# Patient Record
Sex: Male | Born: 1980 | Race: White | Hispanic: No | Marital: Married | Smoking: Former smoker
Health system: Southern US, Community
[De-identification: ages and names within clinical notes are randomized; demographics above are authoritative.]

---

## 2004-07-25 HISTORY — PX: MICRODISCECTOMY LUMBAR: SUR864

## 2011-04-19 DIAGNOSIS — F17200 Nicotine dependence, unspecified, uncomplicated: Secondary | ICD-10-CM | POA: Insufficient documentation

## 2011-04-19 DIAGNOSIS — J069 Acute upper respiratory infection, unspecified: Secondary | ICD-10-CM | POA: Insufficient documentation

## 2014-02-26 DIAGNOSIS — R200 Anesthesia of skin: Secondary | ICD-10-CM | POA: Insufficient documentation

## 2014-02-26 DIAGNOSIS — M25529 Pain in unspecified elbow: Secondary | ICD-10-CM | POA: Insufficient documentation

## 2016-10-03 DIAGNOSIS — R21 Rash and other nonspecific skin eruption: Secondary | ICD-10-CM | POA: Insufficient documentation

## 2018-08-08 ENCOUNTER — Ambulatory Visit (INDEPENDENT_AMBULATORY_CARE_PROVIDER_SITE_OTHER): Payer: Self-pay | Admitting: Nurse Practitioner

## 2018-08-08 VITALS — BP 120/80 | HR 68 | Temp 98.6°F | Resp 20 | Wt 194.2 lb

## 2018-08-08 DIAGNOSIS — B9689 Other specified bacterial agents as the cause of diseases classified elsewhere: Secondary | ICD-10-CM

## 2018-08-08 DIAGNOSIS — J019 Acute sinusitis, unspecified: Secondary | ICD-10-CM

## 2018-08-08 DIAGNOSIS — R509 Fever, unspecified: Secondary | ICD-10-CM

## 2018-08-08 LAB — POCT INFLUENZA A/B
INFLUENZA B, POC: NEGATIVE
Influenza A, POC: NEGATIVE

## 2018-08-08 MED ORDER — PSEUDOEPH-BROMPHEN-DM 30-2-10 MG/5ML PO SYRP: 5.0000 mL | ORAL_SOLUTION | Freq: Four times a day (QID) | ORAL | 0 refills | Status: AC | PRN

## 2018-08-08 MED ORDER — FLUTICASONE PROPIONATE 50 MCG/ACT NA SUSP: 2.0000 | Freq: Every day | NASAL | 0 refills | Status: DC

## 2018-08-08 MED ORDER — AMOXICILLIN-POT CLAVULANATE 875-125 MG PO TABS: 1.0000 | ORAL_TABLET | Freq: Two times a day (BID) | ORAL | 0 refills | Status: AC

## 2018-08-08 NOTE — Progress Notes (Signed)
Subjective:  Jesus Carroll is a 38 y.o. male who presents for evaluation of URI like symptoms.  Symptoms include facial pain, headache described as dull ache, nasal congestion, productive cough with brown colored sputum and sore throat.  Onset of symptoms was 7 days ago,, fever started one day ago and has been gradually worsening since that time.  Patient states he was starting to feel better, but suddenly felt worse 1 day ago when the fever started.  Treatment to date:  Nyquil, Ibuprofen.  High risk factors for influenza complications:  none.  The following portions of the patient's history were reviewed and updated as appropriate:  allergies, current medications and past medical history.  Constitutional: positive for anorexia, fatigue and fevers, negative for chills, malaise and sweats Eyes: negative Ears, nose, mouth, throat, and face: positive for nasal congestion and sore throat, negative for ear drainage, earaches and hoarseness Respiratory: positive for cough and sputum, negative for asthma, chronic bronchitis, dyspnea on exertion, pneumonia, stridor and wheezing Cardiovascular: negative Gastrointestinal: positive for decreased appetite, negative for abdominal pain, constipation, diarrhea, nausea and vomiting Neurological: positive for headaches, negative for coordination problems, dizziness, paresthesia, tremors, vertigo and weakness Allergic/Immunologic: positive for hay fever Objective:  BP 120/80 (BP Location: Right Arm, Patient Position: Sitting)   Pulse 68   Temp 98.6 F (37 C)   Resp 20   Wt 194 lb 3.2 oz (88.1 kg)   SpO2 95%  General appearance: alert, cooperative, fatigued and no distress Head: Normocephalic, without obvious abnormality, atraumatic Eyes: conjunctivae/corneas clear. PERRL, EOM's intact. Fundi benign. Ears: normal TM's and external ear canals both ears Nose: no discharge, moderate congestion, turbinates swollen, inflamed, mild maxillary sinus tenderness  bilateral, mild frontal sinus tenderness bilateral Throat: lips, mucosa, and tongue normal; teeth and gums normal Lungs: clear to auscultation bilaterally Heart: regular rate and rhythm, S1, S2 normal, no murmur, click, rub or gallop Abdomen: soft, non-tender; bowel sounds normal; no masses,  no organomegaly Pulses: 2+ and symmetric Skin: Skin color, texture, turgor normal. No rashes or lesions Lymph nodes: cervical and submandibular nodes normal Neurologic: Grossly normal   Rechecked patient's O2 sats: 97% prior to discharge    Assessment:  Acute Bacterial Sinusitis    Plan:    Exam findings, diagnosis etiology and medication use and indications reviewed with patient. Follow- Up and discharge instructions provided. No emergent/urgent issues found on exam.  Based on the patient's clinical presentation, symptoms, and physical assessment, patient symptoms are congruent with that of bacterial etiology.  Patient also has purulent nasal drainage, along with fever although it was not measured.  Patient's gums are consistent with that of double sickening as well.  Will prescribe an antibiotic for the patient along with symptomatic treatment for his cough.  Patient education was provided. Patient verbalized understanding of information provided and agrees with plan of care (POC), all questions answered. The patient is advised to call or return to clinic if condition does not see an improvement in symptoms, or to seek the care of the closest emergency department if condition worsens with the above plan.   1. Fever, unspecified fever cause  - POCT Influenza A/B  2. Acute bacterial sinusitis  - amoxicillin-clavulanate (AUGMENTIN) 875-125 MG tablet; Take 1 tablet by mouth 2 (two) times daily for 7 days.  Dispense: 14 tablet; Refill: 0 - fluticasone (FLONASE) 50 MCG/ACT nasal spray; Place 2 sprays into both nostrils daily for 10 days.  Dispense: 16 g; Refill: 0 - brompheniramine-pseudoephedrine-DM  30-2-10 MG/5ML syrup;  Take 5 mLs by mouth 4 (four) times daily as needed for up to 7 days.  Dispense: 150 mL; Refill: 0 -Take medication as prescribed. -Ibuprofen or Tylenol for pain, fever, or general discomfort. -Increase fluids. -Sleep elevated on at least 2 pillows at bedtime to help with cough. -Use a humidifier or vaporizer when at home and during sleep. -May use a teaspoon of honey or over-the-counter cough drops to help with cough. -May use normal saline nasal spray to help with nasal congestion throughout the day. -Follow-up if with your PCP if symptoms do not improve.

## 2018-08-08 NOTE — Patient Instructions (Signed)
Sinusitis, Adult -Take medication as prescribed. -Ibuprofen or Tylenol for pain, fever, or general discomfort. -Increase fluids. -Sleep elevated on at least 2 pillows at bedtime to help with cough. -Use a humidifier or vaporizer when at home and during sleep. -May use a teaspoon of honey or over-the-counter cough drops to help with cough. -May use normal saline nasal spray to help with nasal congestion throughout the day. -Follow-up if with your PCP if symptoms do not improve.   Sinusitis is inflammation of your sinuses. Sinuses are hollow spaces in the bones around your face. Your sinuses are located:  Around your eyes.  In the middle of your forehead.  Behind your nose.  In your cheekbones. Mucus normally drains out of your sinuses. When your nasal tissues become inflamed or swollen, mucus can become trapped or blocked. This allows bacteria, viruses, and fungi to grow, which leads to infection. Most infections of the sinuses are caused by a virus. Sinusitis can develop quickly. It can last for up to 4 weeks (acute) or for more than 12 weeks (chronic). Sinusitis often develops after a cold. What are the causes? This condition is caused by anything that creates swelling in the sinuses or stops mucus from draining. This includes:  Allergies.  Asthma.  Infection from bacteria or viruses.  Deformities or blockages in your nose or sinuses.  Abnormal growths in the nose (nasal polyps).  Pollutants, such as chemicals or irritants in the air.  Infection from fungi (rare). What increases the risk? You are more likely to develop this condition if you:  Have a weak body defense system (immune system).  Do a lot of swimming or diving.  Overuse nasal sprays.  Smoke. What are the signs or symptoms? The main symptoms of this condition are pain and a feeling of pressure around the affected sinuses. Other symptoms include:  Stuffy nose or congestion.  Thick drainage from your  nose.  Swelling and warmth over the affected sinuses.  Headache.  Upper toothache.  A cough that may get worse at night.  Extra mucus that collects in the throat or the back of the nose (postnasal drip).  Decreased sense of smell and taste.  Fatigue.  A fever.  Sore throat.  Bad breath. How is this diagnosed? This condition is diagnosed based on:  Your symptoms.  Your medical history.  A physical exam.  Tests to find out if your condition is acute or chronic. This may include: ? Checking your nose for nasal polyps. ? Viewing your sinuses using a device that has a light (endoscope). ? Testing for allergies or bacteria. ? Imaging tests, such as an MRI or CT scan. In rare cases, a bone biopsy may be done to rule out more serious types of fungal sinus disease. How is this treated? Treatment for sinusitis depends on the cause and whether your condition is chronic or acute.  If caused by a virus, your symptoms should go away on their own within 10 days. You may be given medicines to relieve symptoms. They include: ? Medicines that shrink swollen nasal passages (topical intranasal decongestants). ? Medicines that treat allergies (antihistamines). ? A spray that eases inflammation of the nostrils (topical intranasal corticosteroids). ? Rinses that help get rid of thick mucus in your nose (nasal saline washes).  If caused by bacteria, your health care provider may recommend waiting to see if your symptoms improve. Most bacterial infections will get better without antibiotic medicine. You may be given antibiotics if you have: ?  A severe infection. ? A weak immune system.  If caused by narrow nasal passages or nasal polyps, you may need to have surgery. Follow these instructions at home: Medicines  Take, use, or apply over-the-counter and prescription medicines only as told by your health care provider. These may include nasal sprays.  If you were prescribed an antibiotic  medicine, take it as told by your health care provider. Do not stop taking the antibiotic even if you start to feel better. Hydrate and humidify   Drink enough fluid to keep your urine pale yellow. Staying hydrated will help to thin your mucus.  Use a cool mist humidifier to keep the humidity level in your home above 50%.  Inhale steam for 10-15 minutes, 3-4 times a day, or as told by your health care provider. You can do this in the bathroom while a hot shower is running.  Limit your exposure to cool or dry air. Rest  Rest as much as possible.  Sleep with your head raised (elevated).  Make sure you get enough sleep each night. General instructions   Apply a warm, moist washcloth to your face 3-4 times a day or as told by your health care provider. This will help with discomfort.  Wash your hands often with soap and water to reduce your exposure to germs. If soap and water are not available, use hand sanitizer.  Do not smoke. Avoid being around people who are smoking (secondhand smoke).  Keep all follow-up visits as told by your health care provider. This is important. Contact a health care provider if:  You have a fever.  Your symptoms get worse.  Your symptoms do not improve within 10 days. Get help right away if:  You have a severe headache.  You have persistent vomiting.  You have severe pain or swelling around your face or eyes.  You have vision problems.  You develop confusion.  Your neck is stiff.  You have trouble breathing. Summary  Sinusitis is soreness and inflammation of your sinuses. Sinuses are hollow spaces in the bones around your face.  This condition is caused by nasal tissues that become inflamed or swollen. The swelling traps or blocks the flow of mucus. This allows bacteria, viruses, and fungi to grow, which leads to infection.  If you were prescribed an antibiotic medicine, take it as told by your health care provider. Do not stop taking  the antibiotic even if you start to feel better.  Keep all follow-up visits as told by your health care provider. This is important. This information is not intended to replace advice given to you by your health care provider. Make sure you discuss any questions you have with your health care provider. Document Released: 07/11/2005 Document Revised: 12/11/2017 Document Reviewed: 12/11/2017 Elsevier Interactive Patient Education  2019 [employer].

## 2018-08-10 ENCOUNTER — Telehealth: Payer: Self-pay

## 2018-08-10 NOTE — Telephone Encounter (Signed)
Patient states he is feeling good. 

## 2018-12-26 ENCOUNTER — Encounter: Payer: Self-pay | Admitting: Primary Care

## 2018-12-26 ENCOUNTER — Other Ambulatory Visit: Payer: Self-pay

## 2018-12-26 ENCOUNTER — Ambulatory Visit: Payer: 59 | Admitting: Primary Care

## 2018-12-26 VITALS — BP 118/82 | HR 69 | Temp 98.2°F | Ht 67.0 in | Wt 198.2 lb

## 2018-12-26 DIAGNOSIS — Z808 Family history of malignant neoplasm of other organs or systems: Secondary | ICD-10-CM

## 2018-12-26 DIAGNOSIS — Z0000 Encounter for general adult medical examination without abnormal findings: Secondary | ICD-10-CM

## 2018-12-26 DIAGNOSIS — Z8042 Family history of malignant neoplasm of prostate: Secondary | ICD-10-CM

## 2018-12-26 NOTE — Progress Notes (Signed)
Subjective:    Patient ID: Lucila MaineStephen F Grassel, male    DOB: [birth year], 38 y.o.   MRN: [id number]  HPI  Mr. Marena ChancyWebber is a 38 year old male who presents today to establish care and for complete physical.  Immunizations: -Tetanus: Completed in July 2019 -Influenza: Gets annually  Diet: He endorses a healthy diet, some vegetables, lean protein, some fruit. Drinks mostly water, some diet soda. Infrequent sweets.   Exercise: He is walking, lifts weights three times weekly. Eye exam: Completed several years ago Dental exam: No recent exam  BP Readings from Last 3 Encounters:  12/26/18 118/82  08/08/18 120/80      Review of Systems  Constitutional: Negative for unexpected weight change.  HENT: Negative for rhinorrhea.   Respiratory: Negative for cough and shortness of breath.   Cardiovascular: Negative for chest pain.  Gastrointestinal: Negative for constipation and diarrhea.  Genitourinary: Negative for difficulty urinating.  Musculoskeletal: Negative for arthralgias and myalgias.  Skin: Negative for rash.  Allergic/Immunologic: Negative for environmental allergies.  Neurological: Negative for dizziness, numbness and headaches.  Psychiatric/Behavioral: The patient is not nervous/anxious.        History reviewed. No pertinent past medical history.   Social History   Socioeconomic History  . Marital status: Married    Spouse name: Not on file  . Number of children: Not on file  . Years of education: Not on file  . Highest education level: Not on file  Occupational History  . Not on file  Social Needs  . Financial resource strain: Not on file  . Food insecurity:    Worry: Not on file    Inability: Not on file  . Transportation needs:    Medical: Not on file    Non-medical: Not on file  Tobacco Use  . Smoking status: Never Smoker  . Smokeless tobacco: Never Used  Substance and Sexual Activity  . Alcohol use: Yes  . Drug use: Not on file  . Sexual activity: Not on  file  Lifestyle  . Physical activity:    Days per week: Not on file    Minutes per session: Not on file  . Stress: Not on file  Relationships  . Social connections:    Talks on phone: Not on file    Gets together: Not on file    Attends religious service: Not on file    Active member of club or organization: Not on file    Attends meetings of clubs or organizations: Not on file    Relationship status: Not on file  . Intimate partner violence:    Fear of current or ex partner: Not on file    Emotionally abused: Not on file    Physically abused: Not on file    Forced sexual activity: Not on file  Other Topics Concern  . 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   Married.   1 child.   [place]     Past Surgical History:  Procedure Laterality Date  . MICRODISCECTOMY LUMBAR  2006    Family History  Problem Relation Age of Onset  . Breast cancer Mother   . Skin cancer Mother   . Prostate cancer Father [id number]  . Thyroid cancer Father   . Lung cancer Maternal Grandfather   . Cancer Paternal Grandfather        Unclear    No Known Allergies  Current Outpatient Medications on  File Prior to Visit  Medication Sig Dispense Refill  . fluticasone (FLONASE) 50 MCG/ACT nasal spray Place 2 sprays into both nostrils daily for 10 days. (Patient taking differently: Place 1 spray into both nostrils daily. ) 16 g 0   No current facility-administered medications on file prior to visit.     BP 118/82   Pulse 69   Temp 98.2 F (36.8 C) (Oral)   Ht 5\' 7"  (1.702 m)   Wt 198 lb 4 oz (89.9 kg)   SpO2 98%   BMI 31.05 kg/m    Objective:   Physical Exam  Constitutional: He is oriented to person, place, and time. He appears well-nourished.  HENT:  Mouth/Throat: No oropharyngeal exudate.  Eyes: Pupils are equal, round, and reactive to light. EOM are normal.  Neck: Neck supple. No thyromegaly present.  Cardiovascular: Normal rate and  regular rhythm.  Respiratory: Effort normal and breath sounds normal.  GI: Soft. Bowel sounds are normal. There is no abdominal tenderness.  Musculoskeletal: Normal range of motion.  Neurological: He is alert and oriented to person, place, and time.  Skin: Skin is warm and dry.  Psychiatric: He has a normal mood and affect.           Assessment & Plan:

## 2018-12-26 NOTE — Assessment & Plan Note (Signed)
Tetanus UTD. Gets influenza vaccination annually. Check PSA given family history of prostate cancer. Encouraged him to continue with regular exercise, work on diet.  Exam unremarkable.  Labs pending. Follow up in 1 year for CPE.

## 2018-12-26 NOTE — Patient Instructions (Addendum)
Stop by the lab prior to leaving today. I will notify you of your results once received.   Continue exercising. You should be getting 150 minutes of moderate intensity exercise weekly.  It's important to improve your diet by reducing consumption of fast food, fried food, processed snack foods, sugary drinks. Increase consumption of fresh vegetables and fruits, whole grains, water.  Ensure you are drinking 64 ounces of water daily.  Check out [facility] in [place] or [facility] in [place].  It was a pleasure to meet you today! Please don't hesitate to call or message me with any questions. Welcome to [facility]!    Preventive Care 40-64 Years, Male Preventive care refers to lifestyle choices and visits with your health care provider that can promote health and wellness. What does preventive care include?   A yearly physical exam. This is also called an annual well check.  Dental exams once or twice a year.  Routine eye exams. Ask your health care provider how often you should have your eyes checked.  Personal lifestyle choices, including: ? Daily care of your teeth and gums. ? Regular physical activity. ? Eating a healthy diet. ? Avoiding tobacco and drug use. ? Limiting alcohol use. ? Practicing safe sex. ? Taking low-dose aspirin every day starting at age 99. What happens during an annual well check? The services and screenings done by your health care provider during your annual well check will depend on your age, overall health, lifestyle risk factors, and family history of disease. Counseling Your health care provider may ask you questions about your:  Alcohol use.  Tobacco use.  Drug use.  Emotional well-being.  Home and relationship well-being.  Sexual activity.  Eating habits.  Work and work Statistician. Screening You may have the following tests or measurements:  Height, weight, and BMI.  Blood pressure.  Lipid and cholesterol levels. These  may be checked every 5 years, or more frequently if you are over 36 years old.  Skin check.  Lung cancer screening. You may have this screening every year starting at age 64 if you have a 30-pack-year history of smoking and currently smoke or have quit within the past 15 years.  Colorectal cancer screening. All adults should have this screening starting at age 35 and continuing until age 17. Your health care provider may recommend screening at age 43. You will have tests every 1-10 years, depending on your results and the type of screening test. People at increased risk should start screening at an earlier age. Screening tests may include: ? Guaiac-based fecal occult blood testing. ? Fecal immunochemical test (FIT). ? Stool DNA test. ? Virtual colonoscopy. ? Sigmoidoscopy. During this test, a flexible tube with a tiny camera (sigmoidoscope) is used to examine your rectum and lower colon. The sigmoidoscope is inserted through your anus into your rectum and lower colon. ? Colonoscopy. During this test, a long, thin, flexible tube with a tiny camera (colonoscope) is used to examine your entire colon and rectum.  Prostate cancer screening. Recommendations will vary depending on your family history and other risks.  Hepatitis C blood test.  Hepatitis B blood test.  Sexually transmitted disease (STD) testing.  Diabetes screening. This is done by checking your blood sugar (glucose) after you have not eaten for a while (fasting). You may have this done every 1-3 years. Discuss your test results, treatment options, and if necessary, the need for more tests with your health care provider. Vaccines Your health care provider may  recommend certain vaccines, such as:  Influenza vaccine. This is recommended every year.  Tetanus, diphtheria, and acellular pertussis (Tdap, Td) vaccine. You may need a Td booster every 10 years.  Varicella vaccine. You may need this if you have not been  vaccinated.  Zoster vaccine. You may need this after age 60.  Measles, mumps, and rubella (MMR) vaccine. You may need at least one dose of MMR if you were born in 1957 or later. You may also need a second dose.  Pneumococcal 13-valent conjugate (PCV13) vaccine. You may need this if you have certain conditions and have not been vaccinated.  Pneumococcal polysaccharide (PPSV23) vaccine. You may need one or two doses if you smoke cigarettes or if you have certain conditions.  Meningococcal vaccine. You may need this if you have certain conditions.  Hepatitis A vaccine. You may need this if you have certain conditions or if you travel or work in places where you may be exposed to hepatitis A.  Hepatitis B vaccine. You may need this if you have certain conditions or if you travel or work in places where you may be exposed to hepatitis B.  Haemophilus influenzae type b (Hib) vaccine. You may need this if you have certain risk factors. Talk to your health care provider about which screenings and vaccines you need and how often you need them. This information is not intended to replace advice given to you by your health care provider. Make sure you discuss any questions you have with your health care provider. Document Released: 08/07/2015 Document Revised: 08/31/2017 Document Reviewed: 05/12/2015 Elsevier Interactive Patient Education  2019 [employer].

## 2018-12-27 LAB — COMPREHENSIVE METABOLIC PANEL
ALT: 37 U/L (ref 0–53)
AST: 30 U/L (ref 0–37)
Albumin: 4.4 g/dL (ref 3.5–5.2)
Alkaline Phosphatase: 77 U/L (ref 39–117)
BUN: 18 mg/dL (ref 6–23)
CO2: 28 mEq/L (ref 19–32)
Calcium: 9.6 mg/dL (ref 8.4–10.5)
Chloride: 103 mEq/L (ref 96–112)
Creatinine, Ser: 1.12 mg/dL (ref 0.40–1.50)
GFR: 72.61 mL/min (ref >60.00)
Glucose, Bld: 79 mg/dL (ref 70–99)
Potassium: 3.7 mEq/L (ref 3.5–5.1)
Sodium: 141 mEq/L (ref 135–145)
Total Bilirubin: 0.5 mg/dL (ref 0.2–1.2)
Total Protein: 7.1 g/dL (ref 6.0–8.3)

## 2018-12-27 LAB — LIPID PANEL
Cholesterol: 244 mg/dL — ABNORMAL HIGH (ref 0–200)
HDL: 40 mg/dL (ref >39.00)
LDL Cholesterol: 165 mg/dL — ABNORMAL HIGH (ref 0–99)
NonHDL: 203.57
Total CHOL/HDL Ratio: 6
Triglycerides: 194 mg/dL — ABNORMAL HIGH (ref 0.0–149.0)
VLDL: 38.8 mg/dL (ref 0.0–40.0)

## 2018-12-27 LAB — PSA: PSA: 1.08 ng/mL (ref 0.10–4.00)

## 2018-12-27 LAB — TSH: TSH: 1.19 u[IU]/mL (ref 0.35–4.50)

## 2018-12-27 LAB — HEMOGLOBIN A1C: Hgb A1c MFr Bld: 5.5 % (ref 4.6–6.5)

## 2018-12-28 ENCOUNTER — Encounter: Payer: Self-pay | Admitting: *Deleted

## 2019-01-10 DIAGNOSIS — Z3141 Encounter for fertility testing: Secondary | ICD-10-CM | POA: Diagnosis not present

## 2019-01-10 DIAGNOSIS — N979 Female infertility, unspecified: Secondary | ICD-10-CM | POA: Diagnosis not present

## 2019-04-15 ENCOUNTER — Ambulatory Visit (INDEPENDENT_AMBULATORY_CARE_PROVIDER_SITE_OTHER): Payer: 59 | Admitting: Family Medicine

## 2019-04-15 ENCOUNTER — Encounter: Payer: Self-pay | Admitting: Family Medicine

## 2019-04-15 VITALS — Temp 97.9°F

## 2019-04-15 DIAGNOSIS — R062 Wheezing: Secondary | ICD-10-CM | POA: Diagnosis not present

## 2019-04-15 DIAGNOSIS — R05 Cough: Secondary | ICD-10-CM

## 2019-04-15 DIAGNOSIS — R059 Cough, unspecified: Secondary | ICD-10-CM

## 2019-04-15 MED ORDER — ALBUTEROL SULFATE HFA 108 (90 BASE) MCG/ACT IN AERS: 2.0000 | INHALATION_SPRAY | RESPIRATORY_TRACT | 0 refills | Status: DC | PRN

## 2019-04-15 MED ORDER — HYDROCODONE-HOMATROPINE 5-1.5 MG/5ML PO SYRP: 5.0000 mL | ORAL_SOLUTION | Freq: Every evening | ORAL | 0 refills | Status: DC | PRN

## 2019-04-15 NOTE — Progress Notes (Signed)
Virtual Visit via Video Note  I connected with Jesus Carroll on 04/15/19 at  2:00 PM EDT by a video enabled telemedicine application and verified that I am speaking with the correct person using two identifiers.  Location: Patient: In his office Provider: [place]   I discussed the limitations of evaluation and management by telemedicine and the availability of in person appointments. The patient expressed understanding and agreed to proceed.  History of Present Illness: Chief Complaint  Patient presents with  . Cough    symptoms since 04/12/2019. Post nasal drainage is present.   This is a 38 yo male with the above CC.  Reports 4 days of cough that started with a tickle in his throat.  He can feel drainage going down the back of his throat and this is made his throat well.  He denies fever, body aches, ear pain, headache, shortness of breath.  He has had some wheeze with cough as well as when lying down.  He has a history of seasonal allergies but no history of asthma or albuterol inhaler use.  He has taken some Claritin-D which opened his nose but did not seem to help with his other symptoms. He had similar symptoms a while back and had negative COVID testing.  No past medical history on file. Past Surgical History:  Procedure Laterality Date  . MICRODISCECTOMY LUMBAR  2006   Family History  Problem Relation Age of Onset  . Breast cancer Mother   . Skin cancer Mother   . Prostate cancer Father 33  . Thyroid cancer Father   . Lung cancer Maternal Grandfather   . Cancer Paternal Grandfather        Unclear   Social History   Tobacco Use  . Smoking status: Never Smoker  . Smokeless tobacco: Never Used  Substance Use Topics  . Alcohol use: Yes  . Drug use: Not on file      Observations/Objective: The patient is alert and answers questions appropriately.  His physical skin is unremarkable.  He is normally conversive without shortness of breath or audible wheeze.   He does have a frequent, barking, dry cough.  His mood and affect are appropriate. Temp 97.9 F (36.6 C) Comment: per patient Wt Readings from Last 3 Encounters:  12/26/18 198 lb 4 oz (89.9 kg)  08/08/18 194 lb 3.2 oz (88.1 kg)    Assessment and Plan: 1. Cough -Likely related to seasonal allergies and postnasal drainage.  He was instructed to continue his daily fluticasone and take long acting, nonsedating antihistamine, avoiding decongestants unless nasal congestion present.  Prescription for nighttime cough medicine sent to his pharmacy and he was instructed to take over-the-counter Delsym or Robitussin-DM for daytime cough suppression. - albuterol (VENTOLIN HFA) 108 (90 Base) MCG/ACT inhaler; Inhale 2 puffs into the lungs every 4 (four) hours as needed for wheezing or shortness of breath (cough, shortness of breath or wheezing.).  Dispense: 8 g; Refill: 0 - HYDROcodone-homatropine (HYCODAN) 5-1.5 MG/5ML syrup; Take 5 mLs by mouth at bedtime as needed for cough.  Dispense: 60 mL; Refill: 0  2. Wheeze -Likely bronchospasm - albuterol (VENTOLIN HFA) 108 (90 Base) MCG/ACT inhaler; Inhale 2 puffs into the lungs every 4 (four) hours as needed for wheezing or shortness of breath (cough, shortness of breath or wheezing.).  Dispense: 8 g; Refill: 0  -Return to clinic precautions reviewed, if no improvement in 7 days or if worsening at any time.  Clarene Reamer, FNP-BC  [place]  [facility], [facility]  04/15/2019 2:16 PM   Follow Up Instructions:    I discussed the assessment and treatment plan with the patient. The patient was provided an opportunity to ask questions and all were answered. The patient agreed with the plan and demonstrated an understanding of the instructions.   The patient was advised to call back or seek an in-person evaluation if the symptoms worsen or if the condition fails to improve as anticipated.   Jesus Belfast, FNP

## 2019-04-23 ENCOUNTER — Encounter: Payer: Self-pay | Admitting: Family Medicine

## 2019-04-24 ENCOUNTER — Other Ambulatory Visit: Payer: Self-pay | Admitting: Family Medicine

## 2019-04-24 DIAGNOSIS — R059 Cough, unspecified: Secondary | ICD-10-CM

## 2019-04-24 DIAGNOSIS — R05 Cough: Secondary | ICD-10-CM

## 2019-04-24 MED ORDER — HYDROCODONE-HOMATROPINE 5-1.5 MG/5ML PO SYRP: 5.0000 mL | ORAL_SOLUTION | Freq: Every evening | ORAL | 0 refills | Status: DC | PRN

## 2019-04-24 NOTE — Telephone Encounter (Signed)
Patient is requesting another Rx for cough suppression. Pt has completed all Rx's give at last OV 04/15/2019. Please advise Jesus Carroll, thanks.  04/15/2019  Assessment and Plan: 1. Cough -Likely related to seasonal allergies and postnasal drainage.  He was instructed to continue his daily fluticasone and take long acting, nonsedating antihistamine, avoiding decongestants unless nasal congestion present.  Prescription for nighttime cough medicine sent to his pharmacy and he was instructed to take over-the-counter Delsym or Robitussin-DM for daytime cough suppression. - albuterol (VENTOLIN HFA) 108 (90 Base) MCG/ACT inhaler; Inhale 2 puffs into the lungs every 4 (four) hours as needed for wheezing or shortness of breath (cough, shortness of breath or wheezing.).  Dispense: 8 g; Refill: 0 - HYDROcodone-homatropine (HYCODAN) 5-1.5 MG/5ML syrup; Take 5 mLs by mouth at bedtime as needed for cough.  Dispense: 60 mL; Refill: 0

## 2019-05-03 ENCOUNTER — Other Ambulatory Visit: Payer: Self-pay

## 2019-05-03 ENCOUNTER — Ambulatory Visit: Payer: 59 | Admitting: Family Medicine

## 2019-05-03 ENCOUNTER — Encounter: Payer: Self-pay | Admitting: Family Medicine

## 2019-05-03 VITALS — BP 110/62 | HR 64 | Temp 98.1°F | Ht 67.0 in | Wt 200.1 lb

## 2019-05-03 DIAGNOSIS — J4520 Mild intermittent asthma, uncomplicated: Secondary | ICD-10-CM

## 2019-05-03 DIAGNOSIS — M545 Low back pain, unspecified: Secondary | ICD-10-CM

## 2019-05-03 DIAGNOSIS — J302 Other seasonal allergic rhinitis: Secondary | ICD-10-CM

## 2019-05-03 DIAGNOSIS — R05 Cough: Secondary | ICD-10-CM | POA: Diagnosis not present

## 2019-05-03 DIAGNOSIS — G8929 Other chronic pain: Secondary | ICD-10-CM | POA: Diagnosis not present

## 2019-05-03 DIAGNOSIS — R053 Chronic cough: Secondary | ICD-10-CM

## 2019-05-03 MED ORDER — IBUPROFEN 800 MG PO TABS: 800.0000 mg | ORAL_TABLET | Freq: Three times a day (TID) | ORAL | 0 refills | Status: DC | PRN

## 2019-05-03 MED ORDER — QVAR REDIHALER 40 MCG/ACT IN AERB: 2.0000 | INHALATION_SPRAY | Freq: Two times a day (BID) | RESPIRATORY_TRACT | 0 refills | Status: DC

## 2019-05-03 MED ORDER — BENZONATATE 100 MG PO CAPS: 100.0000 mg | ORAL_CAPSULE | Freq: Three times a day (TID) | ORAL | 1 refills | Status: DC | PRN

## 2019-05-03 NOTE — Patient Instructions (Addendum)
I believe you have a chronic/cyclical cough that is aggravated by reflux , coughing , and drainage.  Goal is to not Cough or clear throat.   Avoid coughing or clearing throat by using non-mint. products/sugarless candy, water, ice chips. Remember NO MINT or Menthol PRODUCTS  Mucinex DM 1-2 every 12 hrs or Delsym 2 tsp every 12 hrs f or cough Tessalon Three times a day  As needed  Cough.   Take omeprazole nightly for 2 weeks   Zyrtec 10mg  at bedtime Chlor tabs 4mg  2 at bedtime  for nasal drip  I have put in referral to allergist   Back Exercises These exercises help to make your trunk and back strong. They also help to keep the lower back flexible. Doing these exercises can help to prevent back pain or lessen existing pain.  If you have back pain, try to do these exercises 2-3 times each day or as told by your doctor.  As you get better, do the exercises once each day. Repeat the exercises more often as told by your doctor.  To stop back pain from coming back, do the exercises once each day, or as told by your doctor. Exercises Single knee to chest Do these steps 3-5 times in a row for each leg: 1. Lie on your back on a firm bed or the floor with your legs stretched out. 2. Bring one knee to your chest. 3. Grab your knee or thigh with both hands and hold them it in place. 4. Pull on your knee until you feel a gentle stretch in your lower back or buttocks. 5. Keep doing the stretch for 10-30 seconds. 6. Slowly let go of your leg and straighten it. Pelvic tilt Do these steps 5-10 times in a row: 1. Lie on your back on a firm bed or the floor with your legs stretched out. 2. Bend your knees so they point up to the ceiling. Your feet should be flat on the floor. 3. Tighten your lower belly (abdomen) muscles to press your lower back against the floor. This will make your tailbone point up to the ceiling instead of pointing down to your feet or the floor. 4. Stay in this position for 5-10  seconds while you gently tighten your muscles and breathe evenly. Cat-cow Do these steps until your lower back bends more easily: 1. Get on your hands and knees on a firm surface. Keep your hands under your shoulders, and keep your knees under your hips. You may put padding under your knees. 2. Let your head hang down toward your chest. Tighten (contract) the muscles in your belly. Point your tailbone toward the floor so your lower back becomes rounded like the back of a cat. 3. Stay in this position for 5 seconds. 4. Slowly lift your head. Let the muscles of your belly relax. Point your tailbone up toward the ceiling so your back forms a sagging arch like the back of a cow. 5. Stay in this position for 5 seconds.  Press-ups Do these steps 5-10 times in a row: 1. Lie on your belly (face-down) on the floor. 2. Place your hands near your head, about shoulder-width apart. 3. While you keep your back relaxed and keep your hips on the floor, slowly straighten your arms to raise the top half of your body and lift your shoulders. Do not use your back muscles. You may change where you place your hands in order to make yourself more comfortable. 4. Stay in  this position for 5 seconds. 5. Slowly return to lying flat on the floor.  Bridges Do these steps 10 times in a row: 1. Lie on your back on a firm surface. 2. Bend your knees so they point up to the ceiling. Your feet should be flat on the floor. Your arms should be flat at your sides, next to your body. 3. Tighten your butt muscles and lift your butt off the floor until your waist is almost as high as your knees. If you do not feel the muscles working in your butt and the back of your thighs, slide your feet 1-2 inches farther away from your butt. 4. Stay in this position for 3-5 seconds. 5. Slowly lower your butt to the floor, and let your butt muscles relax. If this exercise is too easy, try doing it with your arms crossed over your chest. Belly  crunches Do these steps 5-10 times in a row: 1. Lie on your back on a firm bed or the floor with your legs stretched out. 2. Bend your knees so they point up to the ceiling. Your feet should be flat on the floor. 3. Cross your arms over your chest. 4. Tip your chin a little bit toward your chest but do not bend your neck. 5. Tighten your belly muscles and slowly raise your chest just enough to lift your shoulder blades a tiny bit off of the floor. Avoid raising your body higher than that, because it can put too much stress on your low back. 6. Slowly lower your chest and your head to the floor. Back lifts Do these steps 5-10 times in a row: 1. Lie on your belly (face-down) with your arms at your sides, and rest your forehead on the floor. 2. Tighten the muscles in your legs and your butt. 3. Slowly lift your chest off of the floor while you keep your hips on the floor. Keep the back of your head in line with the curve in your back. Look at the floor while you do this. 4. Stay in this position for 3-5 seconds. 5. Slowly lower your chest and your face to the floor. Contact a doctor if:  Your back pain gets a lot worse when you do an exercise.  Your back pain does not get better 2 hours after you exercise. If you have any of these problems, stop doing the exercises. Do not do them again unless your doctor says it is okay. Get help right away if:  You have sudden, very bad back pain. If this happens, stop doing the exercises. Do not do them again unless your doctor says it is okay. This information is not intended to replace advice given to you by your health care provider. Make sure you discuss any questions you have with your health care provider. Document Released: 08/13/2010 Document Revised: 04/05/2018 Document Reviewed: 04/05/2018 Elsevier Patient Education  2020 [employer].

## 2019-05-03 NOTE — Progress Notes (Signed)
Subjective:    Patient ID: Jesus Carroll, male    DOB: [birth year], 38 y.o.   MRN: [id number]  HPI Chief Complaint  Patient presents with   Cough    Pt reports having this coughing episode x 3 weeks. Productive now with white/light yellow mucous. No noticed SOB, Chest tx or px. Treating with Rx cough syrup and Delsym   This is a 38 year old male seen today for above chief complaint.  I saw him for a virtual visit 04/15/2019 for complaint of cough that had been going on for 4 days.  His cough was felt likely due to seasonal allergies and postnasal drainage and he was to continue his fluticasone and add a long-acting nonsedating antihistamine.  He was given a prescription for nighttime cough medication and instructed to use over-the-counter medication during the day for cough.  He was also given an albuterol inhaler for intermittent wheeze and cough.  He does feel that the inhaler helps a coughing spell.  He reports improvement in postnasal drainage and feeling of congestion but reports continued cough.  The cough happens throughout the day and is usually triggered by talking which he is required to do quite a lot of for his job.  He reports that he feels a tickle in his throat and once he starts coughing he cannot stop.  He feels some tightness with breathing.  He has been using different kinds of cough drops, albuterol inhaler, Delsym during the day.  The nighttime cough medicine made him feel wired.  He continues on his fluticasone and cetirizine.  He reports that he is very bothered by his allergy symptoms of itchy watery eyes, drippy nose and congestion.  He would like referral to see an allergist.  He has history of microdiscectomy in 2006.  He has occasional back pain especially when playing with his preschool-aged son.  He requests a prescription for ibuprofen 800 mg.  He does not feel that the over-the-counter ibuprofen works as well for him.  He reports that he uses this very infrequently no  more than a couple of times a month.  He does not do regular back exercises or stretching.  History reviewed. No pertinent past medical history. Past Surgical History:  Procedure Laterality Date   MICRODISCECTOMY LUMBAR  2006   Family History  Problem Relation Age of Onset   Breast cancer Mother    Skin cancer Mother    Prostate cancer Father 4   Thyroid cancer Father    Lung cancer Maternal Grandfather    Cancer Paternal Grandfather        Unclear   Social History   Tobacco Use   Smoking status: Never Smoker   Smokeless tobacco: Never Used  Substance Use Topics   Alcohol use: Yes   Drug use: Not on file    Review of Systems Per HPI    Objective:   Physical Exam Vitals signs reviewed.  Constitutional:      General: He is not in acute distress.    Appearance: Normal appearance. He is obese. He is not ill-appearing, toxic-appearing or diaphoretic.  HENT:     Head: Normocephalic and atraumatic.     Right Ear: Tympanic membrane, ear canal and external ear normal.     Left Ear: Tympanic membrane, ear canal and external ear normal.     Nose:     Comments: Slightly deviated septum.    Mouth/Throat:     Mouth: Mucous membranes are moist.  Pharynx: Oropharynx is clear.  Eyes:     Conjunctiva/sclera: Conjunctivae normal.  Cardiovascular:     Rate and Rhythm: Normal rate and regular rhythm.  Pulmonary:     Effort: Pulmonary effort is normal. No respiratory distress.     Breath sounds: No stridor. No wheezing, rhonchi or rales.     Comments: Some slightly decreased airflow which is improved with cough.  Cough is nonproductive. Skin:    General: Skin is warm and dry.  Neurological:     Mental Status: He is alert and oriented to person, place, and time.  Psychiatric:        Mood and Affect: Mood normal.        Behavior: Behavior normal.        Thought Content: Thought content normal.        Judgment: Judgment normal.       BP 110/62 (BP Location:  Left Arm, Patient Position: Sitting, Cuff Size: Normal)    Pulse 64    Temp 98.1 F (36.7 C) (Temporal)    Ht 5\' 7"  (1.702 m)    Wt 200 lb 1.9 oz (90.8 kg)    SpO2 98%    BMI 31.34 kg/m  Wt Readings from Last 3 Encounters:  05/03/19 200 lb 1.9 oz (90.8 kg)  12/26/18 198 lb 4 oz (89.9 kg)  08/08/18 194 lb 3.2 oz (88.1 kg)       Assessment & Plan:  1. Mild intermittent reactive airway disease without complication -Given persistence of symptoms and with a history of persistent allergic rhinitis, will treat for 1 month needs inhaled corticosteroids.  Also provided him information regarding chronic cough, medications to take and things to avoid.  See AVS for details - beclomethasone (QVAR REDIHALER) 40 MCG/ACT inhaler; Inhale 2 puffs into the lungs 2 (two) times daily.  Dispense: 10.6 g; Refill: 0  2. Seasonal allergic rhinitis, unspecified trigger -Has been maintained on long-acting antihistamine and inhaled nasal steroid without good resolution of symptoms.  Per patient request, will refer him to allergy - Ambulatory referral to Allergy  3. Chronic cough -See #1 - benzonatate (TESSALON) 100 MG capsule; Take 1-2 capsules (100-200 mg total) by mouth 3 (three) times daily as needed.  Dispense: 30 capsule; Refill: 1  4. Chronic midline low back pain without sciatica -Provided information for him about back exercises for stretching and core strengthening and encouraged him to do these several times a week - ibuprofen (ADVIL) 800 MG tablet; Take 1 tablet (800 mg total) by mouth every 8 (eight) hours as needed.  Dispense: 30 tablet; Refill: 0   Clarene Reamer, FNP-BC  [place] [facility] at [facility], [facility]  05/03/2019 1:53 PM

## 2019-06-03 ENCOUNTER — Encounter: Payer: Self-pay | Admitting: Allergy and Immunology

## 2019-06-03 ENCOUNTER — Other Ambulatory Visit: Payer: Self-pay

## 2019-06-03 ENCOUNTER — Ambulatory Visit: Payer: 59 | Admitting: Allergy and Immunology

## 2019-06-03 VITALS — BP 128/80 | HR 77 | Temp 98.0°F | Resp 18 | Ht 67.2 in | Wt 200.4 lb

## 2019-06-03 DIAGNOSIS — J3089 Other allergic rhinitis: Secondary | ICD-10-CM

## 2019-06-03 DIAGNOSIS — H1010 Acute atopic conjunctivitis, unspecified eye: Secondary | ICD-10-CM | POA: Insufficient documentation

## 2019-06-03 DIAGNOSIS — J4520 Mild intermittent asthma, uncomplicated: Secondary | ICD-10-CM | POA: Diagnosis not present

## 2019-06-03 DIAGNOSIS — R05 Cough: Secondary | ICD-10-CM

## 2019-06-03 DIAGNOSIS — H1013 Acute atopic conjunctivitis, bilateral: Secondary | ICD-10-CM | POA: Diagnosis not present

## 2019-06-03 DIAGNOSIS — R053 Chronic cough: Secondary | ICD-10-CM

## 2019-06-03 DIAGNOSIS — R051 Acute cough: Secondary | ICD-10-CM | POA: Insufficient documentation

## 2019-06-03 HISTORY — DX: Acute atopic conjunctivitis, unspecified eye: H10.10

## 2019-06-03 HISTORY — DX: Mild intermittent asthma, uncomplicated: J45.20

## 2019-06-03 MED ORDER — AZELASTINE HCL 0.1 % NA SOLN: 1.0000 | Freq: Two times a day (BID) | NASAL | 5 refills | Status: DC

## 2019-06-03 MED ORDER — OLOPATADINE HCL 0.2 % OP SOLN: 1.0000 [drp] | Freq: Every day | OPHTHALMIC | 5 refills | Status: DC

## 2019-06-03 MED ORDER — ALBUTEROL SULFATE HFA 108 (90 BASE) MCG/ACT IN AERS: 1.0000 | INHALATION_SPRAY | Freq: Four times a day (QID) | RESPIRATORY_TRACT | 2 refills | Status: DC | PRN

## 2019-06-03 MED ORDER — FLOVENT HFA 110 MCG/ACT IN AERO: 2.0000 | INHALATION_SPRAY | Freq: Two times a day (BID) | RESPIRATORY_TRACT | 2 refills | Status: DC

## 2019-06-03 MED ORDER — LEVOCETIRIZINE DIHYDROCHLORIDE 5 MG PO TABS: 5.0000 mg | ORAL_TABLET | Freq: Every evening | ORAL | 5 refills | Status: DC

## 2019-06-03 NOTE — Assessment & Plan Note (Signed)
   Treatment plan as outlined above for allergic rhinitis.  A prescription has been provided for Pataday, one drop per eye daily as needed.  I have also recommended eye lubricant drops (i.e., Natural Tears) as needed. 

## 2019-06-03 NOTE — Assessment & Plan Note (Signed)
   Aeroallergen avoidance measures have been discussed and provided in written form.  A prescription has been provided for levocetirizine(Xyzal), 5 mg daily as needed.  A prescription has been provided for azelastine nasal spray, 1-2 sprays per nostril 2 times daily as needed. Proper nasal spray technique has been discussed and demonstrated.   Nasal saline spray (i.e., Simply Saline) or nasal saline lavage (i.e., NeilMed) is recommended as needed and prior to medicated nasal sprays.  If allergen avoidance measures and medications fail to adequately relieve symptoms, aeroallergen immunotherapy will be considered. 

## 2019-06-03 NOTE — Assessment & Plan Note (Signed)
The most common causes of chronic cough include the following: upper airway cough syndrome (UACS) which is caused by variety of rhinosinus conditions; asthma; gastroesophageal reflux disease (GERD); chronic bronchitis from cigarette smoking or other inhaled environmental irritants; non-asthmatic eosinophilic bronchitis; and bronchiectasis. In prospective studies, these conditions have accounted for up to 94% of the causes of chronic cough in immunocompetent adults. The history and physical examination suggest that his cough is multifactorial with contribution from bronchial hyperresponsiveness and postnasal drainage. We will address these issues at this time.   Treatment plan as outlined above.

## 2019-06-03 NOTE — Assessment & Plan Note (Signed)
   Albuterol HFA, 1 to 2 inhalations every 4-6 hours if needed.  During the spring and fall, add Flovent 110g 2 inhalations 2 times per day until symptoms have returned to baseline.  To maximize pulmonary deposition, a spacer has been provided along with instructions for its proper administration with an HFA inhaler.  Subjective and objective measures of pulmonary function will be followed and the treatment plan will be adjusted accordingly.

## 2019-06-03 NOTE — Patient Instructions (Addendum)
Other allergic rhinitis  Aeroallergen avoidance measures have been discussed and provided in written form.  A prescription has been provided for levocetirizine(Xyzal), 5 mg daily as needed.  A prescription has been provided for azelastine nasal spray, 1-2 sprays per nostril 2 times daily as needed. Proper nasal spray technique has been discussed and demonstrated.   Nasal saline spray (i.e., Simply Saline) or nasal saline lavage (i.e., NeilMed) is recommended as needed and prior to medicated nasal sprays.  If allergen avoidance measures and medications fail to adequately relieve symptoms, aeroallergen immunotherapy will be considered.  Allergic conjunctivitis  Treatment plan as outlined above for allergic rhinitis.  A prescription has been provided for Pataday, one drop per eye daily as needed.  I have also recommended eye lubricant drops (i.e., Natural Tears) as needed.  Coughing/wheezing  Albuterol HFA, 1 to 2 inhalations every 4-6 hours if needed.  During the spring and fall, add Flovent 110g 2 inhalations 2 times per day until symptoms have returned to baseline.  To maximize pulmonary deposition, a spacer has been provided along with instructions for its proper administration with an HFA inhaler.  Subjective and objective measures of pulmonary function will be followed and the treatment plan will be adjusted accordingly.  Cough, persistent The most common causes of chronic cough include the following: upper airway cough syndrome (UACS) which is caused by variety of rhinosinus conditions; asthma; gastroesophageal reflux disease (GERD); chronic bronchitis from cigarette smoking or other inhaled environmental irritants; non-asthmatic eosinophilic bronchitis; and bronchiectasis. In prospective studies, these conditions have accounted for up to 94% of the causes of chronic cough in immunocompetent adults. The history and physical examination suggest that his cough is multifactorial with  contribution from bronchial hyperresponsiveness and postnasal drainage. We will address these issues at this time.   Treatment plan as outlined above.     Return in about 3 months (around 09/03/2019), or if symptoms worsen or fail to improve.  Control of Dust Mite Allergen  House dust mites play a major role in allergic asthma and rhinitis.  They occur in environments with high humidity wherever human skin, the food for dust mites is found. High levels have been detected in dust obtained from mattresses, pillows, carpets, upholstered furniture, bed covers, clothes and soft toys.  The principal allergen of the house dust mite is found in its feces.  A gram of dust may contain 1,000 mites and 250,000 fecal particles.  Mite antigen is easily measured in the air during house cleaning activities.    1. Encase mattresses, including the box spring, and pillow, in an air tight cover.  Seal the zipper end of the encased mattresses with wide adhesive tape. 2. Wash the bedding in water of 130 degrees Farenheit weekly.  Avoid cotton comforters/quilts and flannel bedding: the most ideal bed covering is the dacron comforter. 3. Remove all upholstered furniture from the bedroom. 4. Remove carpets, carpet padding, rugs, and non-washable window drapes from the bedroom.  Wash drapes weekly or use plastic window coverings. 5. Remove all non-washable stuffed toys from the bedroom.  Wash stuffed toys weekly. 6. Have the room cleaned frequently with a vacuum cleaner and a damp dust-mop.  The patient should not be in a room which is being cleaned and should wait 1 hour after cleaning before going into the room. 7. Close and seal all heating outlets in the bedroom.  Otherwise, the room will become filled with dust-laden air.  An electric heater can be used to heat the room.  Reduce indoor humidity to less than 50%.  Do not use a humidifier.   Reducing Pollen Exposure  The American Academy of Allergy, Asthma and  Immunology suggests the following steps to reduce your exposure to pollen during allergy seasons.    1. Do not hang sheets or clothing out to dry; pollen may collect on these items. 2. Do not mow lawns or spend time around freshly cut grass; mowing stirs up pollen. 3. Keep windows closed at night.  Keep car windows closed while driving. 4. Minimize morning activities outdoors, a time when pollen counts are usually at their highest. 5. Stay indoors as much as possible when pollen counts or humidity is high and on windy days when pollen tends to remain in the air longer. 6. Use air conditioning when possible.  Many air conditioners have filters that trap the pollen spores. 7. Use a HEPA room air filter to remove pollen form the indoor air you breathe.   Control of Dog or Cat Allergen  Avoidance is the best way to manage a dog or cat allergy. If you have a dog or cat and are allergic to dog or cats, consider removing the dog or cat from the home. If you have a dog or cat but don't want to find it a new home, or if your family wants a pet even though someone in the household is allergic, here are some strategies that may help keep symptoms at bay:  1. Keep the pet out of your bedroom and restrict it to only a few rooms. Be advised that keeping the dog or cat in only one room will not limit the allergens to that room. 2. Don't pet, hug or kiss the dog or cat; if you do, wash your hands with soap and water. 3. High-efficiency particulate air (HEPA) cleaners run continuously in a bedroom or living room can reduce allergen levels over time. 4. Place electrostatic material sheet in the air inlet vent in the bedroom. 5. Regular use of a high-efficiency vacuum cleaner or a central vacuum can reduce allergen levels. 6. Giving your dog or cat a bath at least once a week can reduce airborne allergen.   Control of Mold Allergen  Mold and fungi can grow on a variety of surfaces provided certain temperature  and moisture conditions exist.  Outdoor molds grow on plants, decaying vegetation and soil.  The major outdoor mold, Alternaria and Cladosporium, are found in very high numbers during hot and dry conditions.  Generally, a late Summer - Fall peak is seen for common outdoor fungal spores.  Rain will temporarily lower outdoor mold spore count, but counts rise rapidly when the rainy period ends.  The most important indoor molds are Aspergillus and Penicillium.  Dark, humid and poorly ventilated basements are ideal sites for mold growth.  The next most common sites of mold growth are the bathroom and the kitchen.  Outdoor [employer] 1. Use air conditioning and keep windows closed 2. Avoid exposure to decaying vegetation. 3. Avoid leaf raking. 4. Avoid grain handling. 5. Consider wearing a face mask if working in moldy areas.  Indoor Mold Control 1. Maintain humidity below 50%. 2. Clean washable surfaces with 5% bleach solution. 3. Remove sources e.g. Contaminated carpets.

## 2019-06-03 NOTE — Progress Notes (Signed)
New Patient Note  RE: Jesus Carroll MRN: [id number] DOB: Jul 18, 1981 Date of Office Visit: 06/03/2019  Referring provider: Doreene Nestlark, Katherine K, NP Primary care provider: Doreene Nestlark, Katherine K, NP  Chief Complaint: Cough, Wheezing, and Allergic Rhinitis    History of present illness: Jesus Carroll is a 38 y.o. male seen today in consultation requested by Vernona RiegerKatherine Clark, NP.   He reports that in the spring and the fall he experiences a persistent cough that last approximately for 4 weeks.  This has occurred over the past 2 years.  The cough is described as dry/nonproductive at times and wet/productive at other times.  During the month of the productive cough the cough occurs "all the time."  In addition to the coughing, during the months in spring and fall he also experiences occasional wheezing.  He reports that in the springtime in the fall he experiences postnasal drainage, nasal congestion, rhinorrhea, sneezing, nasal pruritus, and ocular pruritus.  He reports that the persistent cough at times originated from the chest and at other times originated from irritation in the base of the throat.  This past month he was prescribed Qvar 40 g, 2 inhalations twice a day.  After starting the Qvar the persistent cough improved significantly and he was left only with occasional coughing which she felt was due to postnasal drainage.  He has attempted to control the nasal and ocular symptoms with over-the-counter antihistamines and/or fluticasone nasal spray.  The nasal and ocular symptoms occur year around but are most frequent and severe during the spring and fall.  Assessment and plan: Other allergic rhinitis  Aeroallergen avoidance measures have been discussed and provided in written form.  A prescription has been provided for levocetirizine(Xyzal), 5 mg daily as needed.  A prescription has been provided for azelastine nasal spray, 1-2 sprays per nostril 2 times daily as needed. Proper nasal spray  technique has been discussed and demonstrated.   Nasal saline spray (i.e., Simply Saline) or nasal saline lavage (i.e., NeilMed) is recommended as needed and prior to medicated nasal sprays.  If allergen avoidance measures and medications fail to adequately relieve symptoms, aeroallergen immunotherapy will be considered.  Allergic conjunctivitis  Treatment plan as outlined above for allergic rhinitis.  A prescription has been provided for Pataday, one drop per eye daily as needed.  I have also recommended eye lubricant drops (i.e., Natural Tears) as needed.  Coughing/wheezing  Albuterol HFA, 1 to 2 inhalations every 4-6 hours if needed.  During the spring and fall, add Flovent 110g 2 inhalations 2 times per day until symptoms have returned to baseline.  To maximize pulmonary deposition, a spacer has been provided along with instructions for its proper administration with an HFA inhaler.  Subjective and objective measures of pulmonary function will be followed and the treatment plan will be adjusted accordingly.  Cough, persistent The most common causes of chronic cough include the following: upper airway cough syndrome (UACS) which is caused by variety of rhinosinus conditions; asthma; gastroesophageal reflux disease (GERD); chronic bronchitis from cigarette smoking or other inhaled environmental irritants; non-asthmatic eosinophilic bronchitis; and bronchiectasis. In prospective studies, these conditions have accounted for up to 94% of the causes of chronic cough in immunocompetent adults. The history and physical examination suggest that his cough is multifactorial with contribution from bronchial hyperresponsiveness and postnasal drainage. We will address these issues at this time.   Treatment plan as outlined above.     Meds ordered this encounter  Medications   azelastine (ASTELIN)  0.1 % nasal spray    Sig: Place 1-2 sprays into both nostrils 2 (two) times daily. As needed.     Dispense:  30 mL    Refill:  5   Olopatadine HCl (PATADAY) 0.2 % SOLN    Sig: Place 1 drop into both eyes daily. As needed.    Dispense:  2.5 mL    Refill:  5   albuterol (VENTOLIN HFA) 108 (90 Base) MCG/ACT inhaler    Sig: Inhale 1-2 puffs into the lungs every 6 (six) hours as needed for wheezing or shortness of breath.    Dispense:  18 g    Refill:  2   fluticasone (FLOVENT HFA) 110 MCG/ACT inhaler    Sig: Inhale 2 puffs into the lungs 2 (two) times daily. During flares.    Dispense:  1 Inhaler    Refill:  2   levocetirizine (XYZAL) 5 MG tablet    Sig: Take 1 tablet (5 mg total) by mouth every evening. As needed.    Dispense:  30 tablet    Refill:  5    Diagnostics: Spirometry: FVC was 4.40 L and FEV1 was 3.58 L (93% predicted) with 110 mL postbronchodilator improvement.  This study was performed while the patient was asymptomatic.  Please see scanned spirometry results for details. Epicutaneous testing: Robust reactivity to grass pollen, tree pollen, weed pollen, and ragweed pollen.  Positive to mold. Intradermal testing: Positive to major mold mix #2, #3, #4, dog epithelia, cat hair, and dust mite antigen.   Physical examination: Blood pressure 128/80, pulse 77, temperature 98 F (36.7 C), temperature source Temporal, resp. rate 18, height 5' 7.2" (1.707 m), weight 200 lb 6.4 oz (90.9 kg), SpO2 98 %.  General: Alert, interactive, in no acute distress. HEENT: TMs pearly gray, turbinates moderately edematous without discharge, post-pharynx moderately erythematous. Neck: Supple without lymphadenopathy. Lungs: Clear to auscultation without wheezing, rhonchi or rales. CV: Normal S1, S2 without murmurs. Abdomen: Nondistended, nontender. Skin: Warm and dry, without lesions or rashes. Extremities:  No clubbing, cyanosis or edema. Neuro:   Grossly intact.  Review of systems:  Review of systems negative except as noted in HPI / PMHx or noted below: Review of Systems    Constitutional: Negative.   HENT: Negative.   Eyes: Negative.   Respiratory: Negative.   Cardiovascular: Negative.   Gastrointestinal: Negative.   Genitourinary: Negative.   Musculoskeletal: Negative.   Skin: Negative.   Neurological: Negative.   Endo/Heme/Allergies: Negative.   Psychiatric/Behavioral: Negative.     Past medical history:  Past Medical History:  Diagnosis Date   Coughing/wheezing 06/03/2019    Past surgical history:  Past Surgical History:  Procedure Laterality Date   MICRODISCECTOMY LUMBAR  2006    Family history: Family History  Problem Relation Age of Onset   Breast cancer Mother    Skin cancer Mother    Allergic rhinitis Mother    Prostate cancer Father 54   Thyroid cancer Father    Allergic rhinitis Father    Lung cancer Maternal Grandfather    Cancer Paternal Grandfather        Unclear   Eczema Sister    Angioedema Neg Hx    Asthma Neg Hx    Atopy Neg Hx    Immunodeficiency Neg Hx    Urticaria Neg Hx     Social history: Social History   Socioeconomic History   Marital status: Married    Spouse name: Not on file   Number of children:  Not on file   Years of education: Not on file   Highest education level: Not on file  Occupational History   Not on file  Social Needs   Financial resource strain: Not on file   Food insecurity    Worry: Not on file    Inability: Not on file   Transportation needs    Medical: Not on file    Non-medical: Not on file  Tobacco Use   Smoking status: Former Smoker   Smokeless tobacco: Never Used  Substance and Sexual Activity   Alcohol use: Yes   Drug use: Never   Sexual activity: Not on file  Lifestyle   Physical activity    Days per week: Not on file    Minutes per session: Not on file   Stress: Not on file  Relationships   Social connections    Talks on phone: Not on file    Gets together: Not on file    Attends religious service: Not on file    Active  member of club or organization: Not on file    Attends meetings of clubs or organizations: Not on file    Relationship status: Not on file   Intimate partner violence    Fear of current or ex partner: Not on file    Emotionally abused: Not on file    Physically abused: Not on file    Forced sexual activity: Not on file  Other Topics Concern   Not on file  Social History Narrative   Works as a Counselling psychologist.   Married.   1 child.   Enjoys fishing, going to the [employer] History: He lives in a house with carpeting in the bedroom, gas heat, and central air.  There is a dog in the home which has access to his bedroom.  There is no known mold/water damage in the home.  He is a former cigarette smoker having started in 2001 and quit in 2016.  Current Outpatient Medications  Medication Sig Dispense Refill   ibuprofen (ADVIL) 800 MG tablet Take 1 tablet (800 mg total) by mouth every 8 (eight) hours as needed. 30 tablet 0   albuterol (VENTOLIN HFA) 108 (90 Base) MCG/ACT inhaler Inhale 1-2 puffs into the lungs every 6 (six) hours as needed for wheezing or shortness of breath. 18 g 2   azelastine (ASTELIN) 0.1 % nasal spray Place 1-2 sprays into both nostrils 2 (two) times daily. As needed. 30 mL 5   fluticasone (FLOVENT HFA) 110 MCG/ACT inhaler Inhale 2 puffs into the lungs 2 (two) times daily. During flares. 1 Inhaler 2   levocetirizine (XYZAL) 5 MG tablet Take 1 tablet (5 mg total) by mouth every evening. As needed. 30 tablet 5   Olopatadine HCl (PATADAY) 0.2 % SOLN Place 1 drop into both eyes daily. As needed. 2.5 mL 5   No current facility-administered medications for this visit.     Known medication allergies: No Known Allergies  I appreciate the opportunity to take part in Tadhg's care. Please do not hesitate to contact me with questions.  Sincerely,   R. Edgar Frisk, MD

## 2019-08-27 ENCOUNTER — Other Ambulatory Visit: Payer: Self-pay | Admitting: Primary Care

## 2019-08-27 DIAGNOSIS — Z0000 Encounter for general adult medical examination without abnormal findings: Secondary | ICD-10-CM

## 2019-08-27 DIAGNOSIS — Z8042 Family history of malignant neoplasm of prostate: Secondary | ICD-10-CM

## 2019-09-03 ENCOUNTER — Other Ambulatory Visit: Payer: Self-pay

## 2019-09-03 ENCOUNTER — Other Ambulatory Visit (INDEPENDENT_AMBULATORY_CARE_PROVIDER_SITE_OTHER): Payer: 59

## 2019-09-03 DIAGNOSIS — Z0000 Encounter for general adult medical examination without abnormal findings: Secondary | ICD-10-CM | POA: Diagnosis not present

## 2019-09-03 DIAGNOSIS — Z125 Encounter for screening for malignant neoplasm of prostate: Secondary | ICD-10-CM | POA: Diagnosis not present

## 2019-09-03 DIAGNOSIS — Z8042 Family history of malignant neoplasm of prostate: Secondary | ICD-10-CM

## 2019-09-03 LAB — COMPREHENSIVE METABOLIC PANEL
ALT: 42 U/L (ref 0–53)
AST: 30 U/L (ref 0–37)
Albumin: 4.6 g/dL (ref 3.5–5.2)
Alkaline Phosphatase: 96 U/L (ref 39–117)
BUN: 12 mg/dL (ref 6–23)
CO2: 28 mEq/L (ref 19–32)
Calcium: 9.7 mg/dL (ref 8.4–10.5)
Chloride: 104 mEq/L (ref 96–112)
Creatinine, Ser: 1.14 mg/dL (ref 0.40–1.50)
GFR: 70.89 mL/min (ref >60.00)
Glucose, Bld: 99 mg/dL (ref 70–99)
Potassium: 3.8 mEq/L (ref 3.5–5.1)
Sodium: 140 mEq/L (ref 135–145)
Total Bilirubin: 0.5 mg/dL (ref 0.2–1.2)
Total Protein: 7.4 g/dL (ref 6.0–8.3)

## 2019-09-03 LAB — CBC
HCT: 48.9 % (ref 39.0–52.0)
Hemoglobin: 16.6 g/dL (ref 13.0–17.0)
MCHC: 34 g/dL (ref 30.0–36.0)
MCV: 90.7 fl (ref 78.0–100.0)
Platelets: 278 10*3/uL (ref 150.0–400.0)
RBC: 5.38 Mil/uL (ref 4.22–5.81)
RDW: 13.2 % (ref 11.5–15.5)
WBC: 10 10*3/uL (ref 4.0–10.5)

## 2019-09-03 LAB — LIPID PANEL
Cholesterol: 240 mg/dL — ABNORMAL HIGH (ref 0–200)
HDL: 38.6 mg/dL — ABNORMAL LOW (ref >39.00)
NonHDL: 201.54
Total CHOL/HDL Ratio: 6
Triglycerides: 214 mg/dL — ABNORMAL HIGH (ref 0.0–149.0)
VLDL: 42.8 mg/dL — ABNORMAL HIGH (ref 0.0–40.0)

## 2019-09-03 LAB — PSA: PSA: 1.21 ng/mL (ref 0.10–4.00)

## 2019-09-03 LAB — LDL CHOLESTEROL, DIRECT: Direct LDL: 166 mg/dL

## 2019-09-05 ENCOUNTER — Other Ambulatory Visit: Payer: Self-pay

## 2019-09-05 ENCOUNTER — Ambulatory Visit (INDEPENDENT_AMBULATORY_CARE_PROVIDER_SITE_OTHER): Payer: 59 | Admitting: Primary Care

## 2019-09-05 ENCOUNTER — Encounter: Payer: Self-pay | Admitting: Primary Care

## 2019-09-05 VITALS — BP 124/76 | HR 67 | Temp 97.0°F | Ht 67.0 in | Wt 200.5 lb

## 2019-09-05 DIAGNOSIS — J3089 Other allergic rhinitis: Secondary | ICD-10-CM

## 2019-09-05 DIAGNOSIS — Z0000 Encounter for general adult medical examination without abnormal findings: Secondary | ICD-10-CM | POA: Diagnosis not present

## 2019-09-05 DIAGNOSIS — E785 Hyperlipidemia, unspecified: Secondary | ICD-10-CM | POA: Diagnosis not present

## 2019-09-05 NOTE — Assessment & Plan Note (Signed)
Likely familial given overall healthy diet, he would certainly benefit from regular exercise.  He will work on aerobic exercises weekly as discussed. Continue to monitor.   Consider statin therapy if unable to lower.

## 2019-09-05 NOTE — Progress Notes (Signed)
Subjective:    Patient ID: Jesus Carroll, male    DOB: 11-01-1980, 39 y.o.   MRN: [id number]  HPI  This visit occurred during the SARS-CoV-2 public health emergency.  Safety protocols were in place, including screening questions prior to the visit, additional usage of staff PPE, and extensive cleaning of exam room while observing appropriate contact time as indicated for disinfecting solutions.   Jesus Carroll is a 39 year old male who presents today for complete physical.  Immunizations: -Tetanus: Completed in 2019 -Influenza: Completed this season   Diet: He endorses a fiar diet.   Diet currently consists of:  Breakfast: Skips Lunch: Caesar salad, fast food salads Dinner: [place], chicken, grilled steak, vegetables (green beans, corn, cauliflower), veggie mac and cheese, occasional bread, home made pizza Snacks: Stopped snacking months ago, very occasionally  Desserts: Ice cream once monthly Beverages: Beer once monthly, water, diet soda  Exercise: Running, walking at times. No regular aerobic exercise.   Eye exam: No recent exam Dental exam: Completes semi-annually   BP Readings from Last 3 Encounters:  09/05/19 124/76  06/03/19 128/80  05/03/19 110/62   Wt Readings from Last 3 Encounters:  09/05/19 200 lb 8 oz (90.9 kg)  06/03/19 200 lb 6.4 oz (90.9 kg)  05/03/19 200 lb 1.9 oz (90.8 kg)     Review of Systems  Constitutional: Negative for unexpected weight change.  HENT: Negative for rhinorrhea.   Respiratory: Negative for cough and shortness of breath.   Cardiovascular: Negative for chest pain.  Gastrointestinal: Negative for constipation and diarrhea.  Genitourinary: Negative for difficulty urinating.  Musculoskeletal: Negative for arthralgias and myalgias.  Skin: Negative for rash.  Allergic/Immunologic: Negative for environmental allergies.  Neurological: Negative for dizziness, numbness and headaches.  Psychiatric/Behavioral: The patient is not  nervous/anxious.        Past Medical History:  Diagnosis Date  . Coughing/wheezing 06/03/2019     Social History   Socioeconomic History  . Marital status: Married    Spouse name: Not on file  . Number of children: Not on file  . Years of education: Not on file  . Highest education level: Not on file  Occupational History  . Not on file  Tobacco Use  . Smoking status: Former Research scientist (life sciences)  . Smokeless tobacco: Never Used  Substance and Sexual Activity  . Alcohol use: Yes  . Drug use: Never  . Sexual activity: Not on file  Other Topics Concern  . Not on file  Social History Narrative   Works as a Counselling psychologist.   Married.   1 child.   Enjoys fishing, going to the [employer] Resource Strain:   . Difficulty of Paying Living Expenses: Not on file  Food Insecurity:   . Worried About Charity fundraiser in the Last Year: Not on file  . Ran Out of Food in the Last Year: Not on file  Transportation Needs:   . Lack of Transportation (Medical): Not on file  . Lack of Transportation (Non-Medical): Not on file  Physical Activity:   . Days of Exercise per Week: Not on file  . Minutes of Exercise per Session: Not on file  Stress:   . Feeling of Stress : Not on file  Social Connections:   . Frequency of Communication with Friends and Family: Not on file  . Frequency of Social Gatherings with Friends and Family: Not on file  . Attends Religious Services:  Not on file  . Active Member of Clubs or Organizations: Not on file  . Attends Archivist Meetings: Not on file  . Marital Status: Not on file  Intimate Partner Violence:   . Fear of Current or Ex-Partner: Not on file  . Emotionally Abused: Not on file  . Physically Abused: Not on file  . Sexually Abused: Not on file    Past Surgical History:  Procedure Laterality Date  . MICRODISCECTOMY LUMBAR  2006    Family History  Problem Relation Age of Onset  . Breast cancer  Mother   . Skin cancer Mother   . Allergic rhinitis Mother   . Prostate cancer Father 35  . Thyroid cancer Father   . Allergic rhinitis Father   . Lung cancer Maternal Grandfather   . Cancer Paternal Grandfather        Unclear  . Eczema Sister   . Angioedema Neg Hx   . Asthma Neg Hx   . Atopy Neg Hx   . Immunodeficiency Neg Hx   . Urticaria Neg Hx     No Known Allergies  Current Outpatient Medications on File Prior to Visit  Medication Sig Dispense Refill  . albuterol (VENTOLIN HFA) 108 (90 Base) MCG/ACT inhaler Inhale 1-2 puffs into the lungs every 6 (six) hours as needed for wheezing or shortness of breath. 18 g 2  . azelastine (ASTELIN) 0.1 % nasal spray Place 1-2 sprays into both nostrils 2 (two) times daily. As needed. 30 mL 5  . fluticasone (FLOVENT HFA) 110 MCG/ACT inhaler Inhale 2 puffs into the lungs 2 (two) times daily. During flares. 1 Inhaler 2  . ibuprofen (ADVIL) 800 MG tablet Take 1 tablet (800 mg total) by mouth every 8 (eight) hours as needed. 30 tablet 0  . levocetirizine (XYZAL) 5 MG tablet Take 1 tablet (5 mg total) by mouth every evening. As needed. 30 tablet 5  . Olopatadine HCl (PATADAY) 0.2 % SOLN Place 1 drop into both eyes daily. As needed. 2.5 mL 5   No current facility-administered medications on file prior to visit.    BP 124/76   Pulse 67   Temp (!) 97 F (36.1 C) (Temporal)   Ht _0  (1.702 m)   Wt 200 lb 8 oz (90.9 kg)   SpO2 98%   BMI 31.40 kg/m    Objective:   Physical Exam  Constitutional: He is oriented to person, place, and time. He appears well-nourished.  HENT:  Right Ear: Tympanic membrane and ear canal normal.  Left Ear: Tympanic membrane and ear canal normal.  Mouth/Throat: Oropharynx is clear and moist.  Eyes: Pupils are equal, round, and reactive to light. EOM are normal.  Cardiovascular: Normal rate and regular rhythm.  Respiratory: Effort normal and breath sounds normal.  GI: Soft. Bowel sounds are normal. There is no  abdominal tenderness.  Musculoskeletal:        General: Normal range of motion.     Cervical back: Neck supple.  Neurological: He is alert and oriented to person, place, and time. No cranial nerve deficit.  Reflex Scores:      Patellar reflexes are 2+ on the right side and 2+ on the left side. Skin: Skin is warm and dry.  Psychiatric: He has a normal mood and affect.           Assessment & Plan:

## 2019-09-05 NOTE — Patient Instructions (Signed)
Start exercising. You should be getting 150 minutes of moderate intensity exercise weekly.  Continue to work on a healthy diet. Ensure you are consuming 64 ounces of water daily.  It was a pleasure to see you today!   Preventive Care 44-41 Years Old, Male Preventive care refers to lifestyle choices and visits with your health care provider that can promote health and wellness. This includes:  A yearly physical exam. This is also called an annual well check.  Regular dental and eye exams.  Immunizations.  Screening for certain conditions.  Healthy lifestyle choices, such as eating a healthy diet, getting regular exercise, not using drugs or products that contain nicotine and tobacco, and limiting alcohol use. What can I expect for my preventive care visit? Physical exam Your health care provider will check:  Height and weight. These may be used to calculate body mass index (BMI), which is a measurement that tells if you are at a healthy weight.  Heart rate and blood pressure.  Your skin for abnormal spots. Counseling Your health care provider may ask you questions about:  Alcohol, tobacco, and drug use.  Emotional well-being.  Home and relationship well-being.  Sexual activity.  Eating habits.  Work and work Statistician. What immunizations do I need?  Influenza (flu) vaccine  This is recommended every year. Tetanus, diphtheria, and pertussis (Tdap) vaccine  You may need a Td booster every 10 years. Varicella (chickenpox) vaccine  You may need this vaccine if you have not already been vaccinated. Zoster (shingles) vaccine  You may need this after age 62. Measles, mumps, and rubella (MMR) vaccine  You may need at least one dose of MMR if you were born in 1957 or later. You may also need a second dose. Pneumococcal conjugate (PCV13) vaccine  You may need this if you have certain conditions and were not previously vaccinated. Pneumococcal polysaccharide  (PPSV23) vaccine  You may need one or two doses if you smoke cigarettes or if you have certain conditions. Meningococcal conjugate (MenACWY) vaccine  You may need this if you have certain conditions. Hepatitis A vaccine  You may need this if you have certain conditions or if you travel or work in places where you may be exposed to hepatitis A. Hepatitis B vaccine  You may need this if you have certain conditions or if you travel or work in places where you may be exposed to hepatitis B. Haemophilus influenzae type b (Hib) vaccine  You may need this if you have certain risk factors. Human papillomavirus (HPV) vaccine  If recommended by your health care provider, you may need three doses over 6 months. You may receive vaccines as individual doses or as more than one vaccine together in one shot (combination vaccines). Talk with your health care provider about the risks and benefits of combination vaccines. What tests do I need? Blood tests  Lipid and cholesterol levels. These may be checked every 5 years, or more frequently if you are over 74 years old.  Hepatitis C test.  Hepatitis B test. Screening  Lung cancer screening. You may have this screening every year starting at age 2 if you have a 30-pack-year history of smoking and currently smoke or have quit within the past 15 years.  Prostate cancer screening. Recommendations will vary depending on your family history and other risks.  Colorectal cancer screening. All adults should have this screening starting at age 31 and continuing until age 17. Your health care provider may recommend screening at age  if you are at increased risk. You will have tests every 1-10 years, depending on your results and the type of screening test.  Diabetes screening. This is done by checking your blood sugar (glucose) after you have not eaten for a while (fasting). You may have this done every 1-3 years.  Sexually transmitted disease (STD)  testing. Follow these instructions at home: Eating and drinking  Eat a diet that includes fresh fruits and vegetables, whole grains, lean protein, and low-fat dairy products.  Take vitamin and mineral supplements as recommended by your health care provider.  Do not drink alcohol if your health care provider tells you not to drink.  If you drink alcohol: ? Limit how much you have to 0-2 drinks a day. ? Be aware of how much alcohol is in your drink. In the [place] one drink equals one 12 oz bottle of beer (355 mL), one 5 oz glass of wine (148 mL), or one 1 oz glass of hard liquor (44 mL). Lifestyle  Take daily care of your teeth and gums.  Stay active. Exercise for at least 30 minutes on 5 or more days each week.  Do not use any products that contain nicotine or tobacco, such as cigarettes, e-cigarettes, and chewing tobacco. If you need help quitting, ask your health care provider.  If you are sexually active, practice safe sex. Use a condom or other form of protection to prevent STIs (sexually transmitted infections).  Talk with your health care provider about taking a low-dose aspirin every day starting at age 50. What's next?  Go to your health care provider once a year for a well check visit.  Ask your health care provider how often you should have your eyes and teeth checked.  Stay up to date on all vaccines. This information is not intended to replace advice given to you by your health care provider. Make sure you discuss any questions you have with your health care provider. Document Revised: 07/05/2018 Document Reviewed: 07/05/2018 Elsevier Patient Education  2020 [employer]  

## 2019-09-05 NOTE — Assessment & Plan Note (Signed)
Chronic, intermittent, seasonal. Currently off all treatment.  Continue PRN use.

## 2019-09-05 NOTE — Assessment & Plan Note (Signed)
Immunizations UTD. PSA UTD, family history of prostate cancer in father. Encouraged regular exercise, healthy diet. Exam today unremarkable. Labs reviewed.

## 2019-10-23 ENCOUNTER — Encounter: Payer: Self-pay | Admitting: Emergency Medicine

## 2019-10-23 ENCOUNTER — Ambulatory Visit
Admission: EM | Admit: 2019-10-23 | Discharge: 2019-10-23 | Disposition: A | Discharge status: Home / Self Care | Payer: 59

## 2019-10-23 ENCOUNTER — Other Ambulatory Visit: Payer: Self-pay

## 2019-10-23 ENCOUNTER — Emergency Department: Payer: 59

## 2019-10-23 ENCOUNTER — Emergency Department
Admission: EM | Admit: 2019-10-23 | Discharge: 2019-10-23 | Disposition: A | Discharge status: Home / Self Care | Payer: 59 | Attending: Emergency Medicine | Admitting: Emergency Medicine

## 2019-10-23 ENCOUNTER — Telehealth: Payer: Self-pay

## 2019-10-23 DIAGNOSIS — G510 Bell's palsy: Secondary | ICD-10-CM | POA: Insufficient documentation

## 2019-10-23 DIAGNOSIS — R29810 Facial weakness: Secondary | ICD-10-CM | POA: Diagnosis present

## 2019-10-23 DIAGNOSIS — R519 Headache, unspecified: Secondary | ICD-10-CM | POA: Diagnosis not present

## 2019-10-23 DIAGNOSIS — R29818 Other symptoms and signs involving the nervous system: Secondary | ICD-10-CM | POA: Diagnosis not present

## 2019-10-23 LAB — PROTIME-INR
INR: 1 (ref 0.8–1.2)
Prothrombin Time: 12.9 seconds (ref 11.4–15.2)

## 2019-10-23 LAB — CBC
HCT: 48.2 % (ref 39.0–52.0)
Hemoglobin: 16.4 g/dL (ref 13.0–17.0)
MCH: 30.7 pg (ref 26.0–34.0)
MCHC: 34 g/dL (ref 30.0–36.0)
MCV: 90.1 fL (ref 80.0–100.0)
Platelets: 230 10*3/uL (ref 150–400)
RBC: 5.35 MIL/uL (ref 4.22–5.81)
RDW: 12 % (ref 11.5–15.5)
WBC: 10.3 10*3/uL (ref 4.0–10.5)
nRBC: 0 % (ref 0.0–0.2)

## 2019-10-23 LAB — COMPREHENSIVE METABOLIC PANEL
ALT: 43 U/L (ref 0–44)
AST: 41 U/L (ref 15–41)
Albumin: 4.4 g/dL (ref 3.5–5.0)
Alkaline Phosphatase: 90 U/L (ref 38–126)
Anion gap: 10 (ref 5–15)
BUN: 12 mg/dL (ref 6–20)
CO2: 30 mmol/L (ref 22–32)
Calcium: 9.6 mg/dL (ref 8.9–10.3)
Chloride: 102 mmol/L (ref 98–111)
Creatinine, Ser: 1.05 mg/dL (ref 0.61–1.24)
GFR calc Af Amer: >60 mL/min (ref >60)
GFR calc non Af Amer: >60 mL/min (ref >60)
Glucose, Bld: 101 mg/dL — ABNORMAL HIGH (ref 70–99)
Potassium: 3.5 mmol/L (ref 3.5–5.1)
Sodium: 142 mmol/L (ref 135–145)
Total Bilirubin: 0.8 mg/dL (ref 0.3–1.2)
Total Protein: 7.8 g/dL (ref 6.5–8.1)

## 2019-10-23 LAB — GLUCOSE, CAPILLARY: Glucose-Capillary: 97 mg/dL (ref 70–99)

## 2019-10-23 LAB — DIFFERENTIAL
Abs Immature Granulocytes: 0.04 10*3/uL (ref 0.00–0.07)
Basophils Absolute: 0 10*3/uL (ref 0.0–0.1)
Basophils Relative: 0 %
Eosinophils Absolute: 0.1 10*3/uL (ref 0.0–0.5)
Eosinophils Relative: 1 %
Immature Granulocytes: 0 %
Lymphocytes Relative: 29 %
Lymphs Abs: 3 10*3/uL (ref 0.7–4.0)
Monocytes Absolute: 1 10*3/uL (ref 0.1–1.0)
Monocytes Relative: 10 %
Neutro Abs: 6.2 10*3/uL (ref 1.7–7.7)
Neutrophils Relative %: 60 %

## 2019-10-23 LAB — APTT: aPTT: 33 seconds (ref 24–36)

## 2019-10-23 MED ORDER — VALACYCLOVIR HCL 500 MG PO TABS
1000.0000 mg | ORAL_TABLET | Freq: Once | ORAL | Status: AC
  Administered 2019-10-23: 1000 mg via ORAL
  Filled 2019-10-23: qty 2

## 2019-10-23 MED ORDER — PREDNISONE 20 MG PO TABS: 60.0000 mg | ORAL_TABLET | Freq: Every day | ORAL | 0 refills | Status: AC

## 2019-10-23 MED ORDER — VALACYCLOVIR HCL 1 G PO TABS: 1000.0000 mg | ORAL_TABLET | Freq: Three times a day (TID) | ORAL | 0 refills | Status: AC

## 2019-10-23 MED ORDER — PREDNISONE 20 MG PO TABS
60.0000 mg | ORAL_TABLET | Freq: Once | ORAL | Status: AC
  Administered 2019-10-23: 60 mg via ORAL
  Filled 2019-10-23: qty 3

## 2019-10-23 MED ORDER — SODIUM CHLORIDE 0.9% FLUSH
3.0000 mL | Freq: Once | INTRAVENOUS | Status: DC
Start: 2019-10-23 — End: 2019-10-23

## 2019-10-23 NOTE — ED Notes (Signed)
Code  Stroke  Called  To [phone] 

## 2019-10-23 NOTE — Discharge Instructions (Addendum)
For your Bell's palsy:  - Take the prednisone and valtrex as prescribed - Use over-the-counter lubricating eye drops at least 4x per day - At night, apply an eye drop and cover the eye with an eye shield to prevent damage  If you develop any new numbness, weakness, or other NEW neurological symptoms, return to the ER immediately  It was a pleasure meeting you! Please call or message me with any questions.

## 2019-10-23 NOTE — ED Notes (Signed)
ED Provider at bedside. 

## 2019-10-23 NOTE — Consult Note (Signed)
Requesting Physician: Erma Heritage    Chief Complaint: Facial droop  I have been asked by Dr. Erma Heritage to see this patient in consultation for code stroke.  HPI: Jesus Carroll is an 39 y.o. male with no significant past medical history who presents with facial droop.  Patient reports that he went to lunch and felt his face and eye droop.  Then noted numbness on his left cheek.  With no improvement in symptoms patient presented for evaluation.  Initial NIHSS of 1.    Date last known well: 10/23/2019 Time last known well: Time: 12:00 tPA Given: No: Not felt to be a stroke, minimal symptoms  Past medical history: None  Family history: Both parents alive and well   Social History: No history of tobacco abuse  Allergies: No Known Allergies  Medications: I have reviewed the patient's current medications. None  ROS: History obtained from the patient  General ROS: negative for - chills, fatigue, fever, night sweats, weight gain or weight loss Psychological ROS: negative for - behavioral disorder, hallucinations, memory difficulties, mood swings or suicidal ideation Ophthalmic ROS: negative for - blurry vision, double vision, eye pain or loss of vision ENT ROS: negative for - epistaxis, nasal discharge, oral lesions, sore throat, tinnitus or vertigo Allergy and Immunology ROS: negative for - hives or itchy/watery eyes Hematological and Lymphatic ROS: negative for - bleeding problems, bruising or swollen lymph nodes Endocrine ROS: negative for - galactorrhea, hair pattern changes, polydipsia/polyuria or temperature intolerance Respiratory ROS: negative for - cough, hemoptysis, shortness of breath or wheezing Cardiovascular ROS: negative for - chest pain, dyspnea on exertion, edema or irregular heartbeat Gastrointestinal ROS: negative for - abdominal pain, diarrhea, hematemesis, nausea/vomiting or stool incontinence Genito-Urinary ROS: negative for - dysuria, hematuria, incontinence or urinary  frequency/urgency Musculoskeletal ROS: negative for - joint swelling or muscular weakness Neurological ROS: as noted in HPI Dermatological ROS: negative for rash and skin lesion changes  Physical Examination: Blood pressure 113/79, pulse 62, temperature 98.2 F (36.8 C), temperature source Oral, resp. rate 17, height 5\' 8"  (1.727 m), weight 91.7 kg, SpO2 97 %.  HEENT-  Normocephalic, no lesions, without obvious abnormality.  Normal external eye and conjunctiva.  Normal TM's bilaterally.  Normal auditory canals and external ears. Normal external nose, mucus membranes and septum.  Normal pharynx. Cardiovascular- S1, S2 normal, pulses palpable throughout   Lungs- chest clear, no wheezing, rales, normal symmetric air entry Abdomen- soft, non-tender; bowel sounds normal; no masses,  no organomegaly Extremities- no edema Lymph-no adenopathy palpable Musculoskeletal-no joint tenderness, deformity or swelling Skin-warm and dry, no hyperpigmentation, vitiligo, or suspicious lesions  Neurological Examination   Mental Status: Alert, oriented, thought content appropriate.  Speech fluent without evidence of aphasia.  Able to follow 3 step commands without difficulty. Cranial Nerves: II: Visual fields grossly normal, pupils equal, round, reactive to light and accommodation III,IV, VI: Extra-ocular motions intact bilaterally.  No blinking noted from the right eye V,VII: right facial droop, facial light touch sensation normal bilaterally VIII: hearing normal bilaterally IX,X: gag reflex present XI: bilateral shoulder shrug XII: midline tongue extension Motor: Right : Upper extremity   5/5    Left:     Upper extremity   5/5  Lower extremity   5/5     Lower extremity   5/5 Tone and bulk:normal tone throughout; no atrophy noted Sensory: Pinprick and light touch intact throughout, bilaterally Deep Tendon Reflexes: Symmetric throughout Plantars: Right: downgoing   Left: downgoing Cerebellar: Normal  finger-to-nose and normal  heel-to-shin testing bilaterally Gait: normal gait and station    Laboratory Studies:  Basic Metabolic Panel: Recent Labs  Lab 10/23/19 1533  NA 142  K 3.5  CL 102  CO2 30  GLUCOSE 101*  BUN 12  CREATININE 1.05  CALCIUM 9.6    Liver Function Tests: Recent Labs  Lab 10/23/19 1533  AST 41  ALT 43  ALKPHOS 90  BILITOT 0.8  PROT 7.8  ALBUMIN 4.4   No results for input(s): LIPASE, AMYLASE in the last 168 hours. No results for input(s): AMMONIA in the last 168 hours.  CBC: Recent Labs  Lab 10/23/19 1533  WBC 10.3  NEUTROABS 6.2  HGB 16.4  HCT 48.2  MCV 90.1  PLT 230    Cardiac Enzymes: No results for input(s): CKTOTAL, CKMB, CKMBINDEX, TROPONINI in the last 168 hours.  BNP: Invalid input(s): POCBNP  CBG: Recent Labs  Lab 10/23/19 0623  JSEGBT 51    Microbiology: No results found for this or any previous visit.  Coagulation Studies: Recent Labs    10/23/19 1533  LABPROT 12.9  INR 1.0    Urinalysis: No results for input(s): COLORURINE, LABSPEC, PHURINE, GLUCOSEU, HGBUR, BILIRUBINUR, KETONESUR, PROTEINUR, UROBILINOGEN, NITRITE, LEUKOCYTESUR in the last 168 hours.  Invalid input(s): APPERANCEUR  Lipid Panel: No results found for: CHOL, TRIG, HDL, CHOLHDL, VLDL, LDLCALC  HgbA1C: No results found for: HGBA1C  Urine Drug Screen:  No results found for: LABOPIA, COCAINSCRNUR, LABBENZ, AMPHETMU, THCU, LABBARB  Alcohol Level: No results for input(s): ETH in the last 168 hours.  Other results: EKG: sinus rhythm at 71 bpm.  Imaging: MR BRAIN WO CONTRAST  Result Date: 10/23/2019 CLINICAL DATA:  Acute neuro deficit. Headache with left facial droop EXAM: MRI HEAD WITHOUT CONTRAST TECHNIQUE: Multiplanar, multiecho pulse sequences of the brain and surrounding structures were obtained without intravenous contrast. COMPARISON:  CT head 10/23/2019 FINDINGS: Brain: No acute infarction, hemorrhage, hydrocephalus, extra-axial  collection or mass lesion. Vascular: Normal arterial flow voids Skull and upper cervical spine: Negative Sinuses/Orbits: Mild mucosal edema paranasal sinuses. Negative orbit Other: None IMPRESSION: Normal MRI brain without contrast. Electronically Signed   By: Franchot Gallo M.D.   On: 10/23/2019 16:59   CT HEAD CODE STROKE WO CONTRAST  Result Date: 10/23/2019 CLINICAL DATA:  Code stroke. Neuro deficit, acute, stroke suspected. Additional history provided: Patient arrives urgent care due to stroke-like symptoms, headache since Monday with left eye droop and left-sided facial droop, weakness with ambulation EXAM: CT HEAD WITHOUT CONTRAST TECHNIQUE: Contiguous axial images were obtained from the base of the skull through the vertex without intravenous contrast. COMPARISON:  No pertinent prior studies available for comparison. FINDINGS: Brain: There is no evidence of acute intracranial hemorrhage, intracranial mass, midline shift or extra-axial fluid collection.No demarcated cortical infarction. Vascular: No hyperdense vessel. Skull: Normal. Negative for fracture or focal lesion. Sinuses/Orbits: Visualized orbits demonstrate no acute abnormality. No significant paranasal sinus disease or mastoid effusion at the imaged levels. These results were called by telephone at the time of interpretation on 10/23/2019 at 3:55 pm to provider Dr. Jimmye Norman, who verbally acknowledged these results. IMPRESSION: Unremarkable non-contrast CT appearance of the brain. No evidence of acute intracranial abnormality Electronically Signed   By: Kellie Simmering DO   On: 10/23/2019 15:56    Assessment: 38 y.o. male with no significant past medical history who presents with right facial droop and complaints of left facial numbness that has resolved.  Exam is more consistent with a Bell's palsy.  Head CT reviewed  and shows no acute changes.     Stroke Risk Factors - none  Plan: 1. Patient to have MRI of the brain without contrast.  If  unremarkable patient to start treatment for Bell's palsy with Acyclovir and Prednisone.  Would also start ASA 81mg  daily.   2. Patient to follow up with PCP  Case discussed with Dr. , MD Neurology [phone] 10/23/2019, 6:48 PM  Addendum: MRI of the brain personally reviewed and is normal.    10/25/2019, MD Neurology [phone]

## 2019-10-23 NOTE — Progress Notes (Signed)
Ch arrived at room in response to Code Stroke. Pt was being taken to CT, and was being asked questions at this time.

## 2019-10-23 NOTE — ED Notes (Signed)
Pt denies pain at this time. Pt states the numbness has improved but the left side of the face still feels weak.

## 2019-10-23 NOTE — ED Provider Notes (Signed)
[facility] Emergency Department Provider Note  ____________________________________________   First MD Initiated Contact with Patient 10/23/19 1538     (approximate)  I have reviewed the triage vital signs and the nursing notes.   HISTORY  Chief Complaint Stroke Like Sx    HPI Jesus Carroll is a 39 y.o. male  With no significant PMHx here with facial weakness and numbness. Just prior to arrival, pt states he began to feel like his left face was "slightly numb" like he was at the dentist, followed by weakness in his R face. He then experienced a mild right sided HA, which he has had off an on for several days. No history of similar episodes. The numbness is improving, weakness has persisted. He feels like he has a hard time closing his R eye. Denies any recent illnesses. No recent tick bites but is outside "a lot." No h/o early strokes in family. No specific alleviating or aggravating factors.       No past medical history on file.   Non-contributory. No history of significant HTN, HLD, early CVA or neurological disease.  PSHx: Non-contributory, no prior CNS surgeries or ENT issues  Social Hx: Non-smoker, no drug use, works with [employer]   There are no problems to display for this patient.    Prior to Admission medications   Medication Sig Start Date End Date Taking? Authorizing Provider  predniSONE (DELTASONE) 20 MG tablet Take 3 tablets (60 mg total) by mouth daily for 7 days. 10/23/19 10/30/19  Shaune Pollack, MD  valACYclovir (VALTREX) 1000 MG tablet Take 1 tablet (1,000 mg total) by mouth 3 (three) times daily for 7 days. 10/23/19 10/30/19  Shaune Pollack, MD    Allergies Patient has no known allergies.  No family history on file.  Social History Social History   Tobacco Use  . Smoking status: Not on file  Substance Use Topics  . Alcohol use: Not on file  . Drug use: Not on file    Review of Systems  Review of Systems    Constitutional: Negative for chills, fatigue and fever.  HENT: Negative for sore throat.   Respiratory: Negative for shortness of breath.   Cardiovascular: Negative for chest pain.  Gastrointestinal: Negative for abdominal pain.  Genitourinary: Negative for flank pain.  Musculoskeletal: Negative for neck pain.  Skin: Negative for rash and wound.  Allergic/Immunologic: Negative for immunocompromised state.  Neurological: Positive for facial asymmetry and numbness. Negative for weakness.  Hematological: Does not bruise/bleed easily.     ____________________________________________  PHYSICAL EXAM:      VITAL SIGNS: ED Triage Vitals  Enc Vitals Group     BP 10/23/19 1531 136/90     Pulse Rate 10/23/19 1531 71     Resp 10/23/19 1531 18     Temp 10/23/19 1531 98.2 F (36.8 C)     Temp Source 10/23/19 1531 Oral     SpO2 10/23/19 1531 99 %     Weight 10/23/19 1532 194 lb (88 kg)     Height 10/23/19 1532 5\' 8"  (1.727 m)     Head Circumference --      Peak Flow --      Pain Score 10/23/19 1532 1     Pain Loc --      Pain Edu? --      Excl. in GC? --      Physical Exam Vitals and nursing note reviewed.  Constitutional:      General: He is  not in acute distress.    Appearance: He is well-developed.  HENT:     Head: Normocephalic and atraumatic.  Eyes:     Conjunctiva/sclera: Conjunctivae normal.  Cardiovascular:     Rate and Rhythm: Normal rate and regular rhythm.     Heart sounds: Normal heart sounds.  Pulmonary:     Effort: Pulmonary effort is normal. No respiratory distress.     Breath sounds: No wheezing.  Abdominal:     General: There is no distension.  Musculoskeletal:     Cervical back: Neck supple.  Skin:    General: Skin is warm.     Capillary Refill: Capillary refill takes less than 2 seconds.     Findings: No rash.  Neurological:     Mental Status: He is alert and oriented to person, place, and time.     Motor: No abnormal muscle tone.       Neurological Exam:  Mental Status: Alert and oriented to person, place, and time. Attention and concentration normal. Speech clear. Recent memory is intact. Cranial Nerves: Visual fields grossly intact. EOMI and PERRLA. No nystagmus noted. Facial sensation intact at forehead, maxillary cheek, and chin/mandible bilaterally, though subjectively diminished throughout L lower face. Significant paralysis of R V2-V3 facial distribution, with slight forehead involvement noted with diminished eye blinking/upper lid strength. Hearing grossly normal. Uvula is midline, and palate elevates symmetrically. Normal SCM and trapezius strength. Tongue midline without fasciculations. Motor: Muscle strength 5/5 in proximal and distal UE and LE bilaterally. No pronator drift. Muscle tone normal.  Sensation: Intact to light touch in upper and lower extremities distally bilaterally.  Gait: Normal without ataxia. Coordination: Normal FTN bilaterally.    ____________________________________________   LABS (all labs ordered are listed, but only abnormal results are displayed)  Labs Reviewed  COMPREHENSIVE METABOLIC PANEL - Abnormal; Notable for the following components:      Result Value   Glucose, Bld 101 (*)    All other components within normal limits  PROTIME-INR  APTT  CBC  DIFFERENTIAL  GLUCOSE, CAPILLARY  CBG MONITORING, ED    ____________________________________________  EKG: Normal sinus rhythm, VR 71. QRS 92, QTc 422. No acute St elevations or depressions. No ischemia or infarct. ________________________________________  RADIOLOGY All imaging, including plain films, CT scans, and ultrasounds, independently reviewed by me, and interpretations confirmed via formal radiology reads.  ED MD interpretation:   CT Head: NAICA MR Brain: Negative  Official radiology report(s): MR BRAIN WO CONTRAST  Result Date: 10/23/2019 CLINICAL DATA:  Acute neuro deficit. Headache with left facial droop EXAM:  MRI HEAD WITHOUT CONTRAST TECHNIQUE: Multiplanar, multiecho pulse sequences of the brain and surrounding structures were obtained without intravenous contrast. COMPARISON:  CT head 10/23/2019 FINDINGS: Brain: No acute infarction, hemorrhage, hydrocephalus, extra-axial collection or mass lesion. Vascular: Normal arterial flow voids Skull and upper cervical spine: Negative Sinuses/Orbits: Mild mucosal edema paranasal sinuses. Negative orbit Other: None IMPRESSION: Normal MRI brain without contrast. Electronically Signed   By: Marlan Palau M.D.   On: 10/23/2019 16:59   CT HEAD CODE STROKE WO CONTRAST  Result Date: 10/23/2019 CLINICAL DATA:  Code stroke. Neuro deficit, acute, stroke suspected. Additional history provided: Patient arrives urgent care due to stroke-like symptoms, headache since Monday with left eye droop and left-sided facial droop, weakness with ambulation EXAM: CT HEAD WITHOUT CONTRAST TECHNIQUE: Contiguous axial images were obtained from the base of the skull through the vertex without intravenous contrast. COMPARISON:  No pertinent prior studies available for comparison. FINDINGS: Brain:  There is no evidence of acute intracranial hemorrhage, intracranial mass, midline shift or extra-axial fluid collection.No demarcated cortical infarction. Vascular: No hyperdense vessel. Skull: Normal. Negative for fracture or focal lesion. Sinuses/Orbits: Visualized orbits demonstrate no acute abnormality. No significant paranasal sinus disease or mastoid effusion at the imaged levels. These results were called by telephone at the time of interpretation on 10/23/2019 at 3:55 pm to provider Dr. Jimmye Norman, who verbally acknowledged these results. IMPRESSION: Unremarkable non-contrast CT appearance of the brain. No evidence of acute intracranial abnormality Electronically Signed   By: Kellie Simmering DO   On: 10/23/2019 15:56    ____________________________________________  PROCEDURES   Procedure(s) performed  (including Critical Care):  Procedures  ____________________________________________  INITIAL IMPRESSION / MDM / [place] / ED COURSE  As part of my medical decision making, I reviewed the following data within the [place] notes reviewed and incorporated, Old chart reviewed, Notes from prior ED visits, and [place] Controlled Substance Database       *Jesus Carroll was evaluated in Emergency Department on 10/23/2019 for the symptoms described in the history of present illness. He was evaluated in the context of the global COVID-19 pandemic, which necessitated consideration that the patient might be at risk for infection with the SARS-CoV-2 virus that causes COVID-19. Institutional protocols and algorithms that pertain to the evaluation of patients at risk for COVID-19 are in a state of rapid change based on information released by regulatory bodies including the CDC and federal and state organizations. These policies and algorithms were followed during the patient's care in the ED.  Some ED evaluations and interventions may be delayed as a result of limited staffing during the pandemic.*     Medical Decision Making:  Very pleasant 39 yo M here with R facial droop. Pt also reports transient numbness sensation L face. Unclear if this is related to his R facial droop or true paresthesia/numbness. On arrival, pt activated as CODE STROKE. Dr. Doy Mince of Neurology at bedside and has evaluated pt. She recommends MR Brain WO. No need for MRA, CT Angio at this time as primary suspicion per Dr. Doy Mince is likely early R Bell's Palsy. If MR negative, Neurology recommends prednisone, valtrex, and outpt follow-up.  MR negative. Pt now does appear to have greater R forehead involvement on serial examinations. No new neurological deficits. Per Neuro, will start on prednisone, valtrex. I've also recommended low-dose ASA and Neuro follow-up. Eye shield provided with  instructions for eye care and protection at home. Advised to return with any new neurological symptoms, vision changes, or other concerns. Labs, imaging ow unremarkable with normal CBC BMP, INR.  ____________________________________________  FINAL CLINICAL IMPRESSION(S) / ED DIAGNOSES  Final diagnoses:  Bell's palsy     MEDICATIONS GIVEN DURING THIS VISIT:  Medications  predniSONE (DELTASONE) tablet 60 mg (60 mg Oral Given 10/23/19 1805)  valACYclovir (VALTREX) tablet 1,000 mg (1,000 mg Oral Given 10/23/19 1806)     ED Discharge Orders         Ordered    predniSONE (DELTASONE) 20 MG tablet  Daily     10/23/19 1739    valACYclovir (VALTREX) 1000 MG tablet  3 times daily     10/23/19 1739           Note:  This document was prepared using Dragon voice recognition software and may include unintentional dictation errors.   Duffy Bruce, MD 10/23/19 2322

## 2019-10-23 NOTE — ED Notes (Signed)
Neurologist in with pt.

## 2019-10-23 NOTE — Telephone Encounter (Signed)
I spoke with Mrs Dupre; pt now has a H/A that started last night; pt still has H/A now pain level is 3. Pt has numbness on lt side of face which started this afternoon; lt eye is drooping or looks swollen. Pt has watering from the eye but no blurred or double vision.No weakness or numbness in lt arm, side or leg. Pt is walking normally. Pt has never had these symptoms before. pts h/a is on the lt temple. No CP, SOB and no dizziness. No drooling from mouth. Do not have a way to ck BP. Pt has no covid symptoms, no travel and no known exposure to + covid. Allayne Gitelman NP said since new symptoms that pt is concerned about could go to UC for eval. Mrs Lindamood said pt will go to [facility] in [place] now.FYI to Allayne Gitelman NP.

## 2019-10-23 NOTE — Progress Notes (Signed)
CODE STROKE- PHARMACY COMMUNICATION   Time CODE STROKE called/page received: 1533  Time response to CODE STROKE was made (in person or via phone): 1550  Time Stroke Kit retrieved from [facility] (only if needed): NA  Name of Provider/Nurse contacted: Dr. Doy Mince, no tPA    Tawnya Crook ,PharmD Clinical Pharmacist  10/23/2019  4:01 PM

## 2019-10-23 NOTE — Telephone Encounter (Signed)
Noted. Please call to check on patient on 10/24/19

## 2019-10-23 NOTE — ED Triage Notes (Signed)
Pt arrives to ER from urgent care due to stroke like sx. Pt states that he has had headache since Monday with left eye droop and left sided facial droop with smile at 12PM today. Pt able to ambulate but reports weakness with ambulation. Alert and oriented X 4. EDP notified.

## 2019-10-23 NOTE — ED Notes (Signed)
Pt to MRI

## 2019-10-23 NOTE — ED Triage Notes (Signed)
EDP at bedside 1527.

## 2019-10-24 ENCOUNTER — Telehealth: Payer: Self-pay | Admitting: Emergency Medicine

## 2019-10-24 NOTE — Telephone Encounter (Signed)
Noted  

## 2019-10-24 NOTE — Telephone Encounter (Signed)
Spoken to patient earlier this morning. He was Dx with Bell's palsy and given Rx for help. He is doing much better.

## 2019-10-24 NOTE — Telephone Encounter (Signed)
Called patient to remind him of Dr. Ferne Coe recommendation of starting low-dose Aspirin. Message sent in MyChart as well, with forward to PCP.

## 2019-11-11 DIAGNOSIS — G510 Bell's palsy: Secondary | ICD-10-CM | POA: Diagnosis not present

## 2019-11-11 DIAGNOSIS — E559 Vitamin D deficiency, unspecified: Secondary | ICD-10-CM | POA: Diagnosis not present

## 2019-11-11 DIAGNOSIS — E538 Deficiency of other specified B group vitamins: Secondary | ICD-10-CM | POA: Diagnosis not present

## 2020-07-07 ENCOUNTER — Ambulatory Visit: Payer: 59 | Admitting: Primary Care

## 2020-07-07 ENCOUNTER — Other Ambulatory Visit: Payer: Self-pay | Admitting: Primary Care

## 2020-07-07 ENCOUNTER — Encounter: Payer: Self-pay | Admitting: Primary Care

## 2020-07-07 ENCOUNTER — Other Ambulatory Visit: Payer: Self-pay

## 2020-07-07 DIAGNOSIS — M545 Low back pain, unspecified: Secondary | ICD-10-CM

## 2020-07-07 DIAGNOSIS — G8929 Other chronic pain: Secondary | ICD-10-CM | POA: Diagnosis not present

## 2020-07-07 DIAGNOSIS — M25561 Pain in right knee: Secondary | ICD-10-CM

## 2020-07-07 DIAGNOSIS — M25569 Pain in unspecified knee: Secondary | ICD-10-CM | POA: Insufficient documentation

## 2020-07-07 MED ORDER — IBUPROFEN 800 MG PO TABS: 800.0000 mg | ORAL_TABLET | Freq: Three times a day (TID) | ORAL | 0 refills | Status: DC | PRN

## 2020-07-07 NOTE — Progress Notes (Signed)
Subjective:    Patient ID: Jesus Carroll, male    DOB: 1980/12/20, 39 y.o.   MRN: [id number]  HPI  This visit occurred during the SARS-CoV-2 public health emergency.  Safety protocols were in place, including screening questions prior to the visit, additional usage of staff PPE, and extensive cleaning of exam room while observing appropriate contact time as indicated for disinfecting solutions.   Jesus Carroll is a 39 year old male who presents today with a chief complaint of knee pain.   He was fishing in [place] three days ago, accidentally slipped on some rocks, fell onto the rocks onto his right knee. After the initial incidence he was able to fish for an additional two hours, felt mild pain but overall not bothresome. It wasn't until later that day when attempting to drive home he noticed increased pain, had to pull over to allow his wife to drive as he could not maneuver the gas pedals.   That evening he applied ice, took ibuprofen and Tylenol, elevated his knee. Since then he's noticed moderate improvement in pain, swelling, and range of motion.    He denies redness, weakness, wounds.  Review of Systems  Musculoskeletal: Positive for arthralgias and joint swelling.       Acute knee pain  Skin: Negative for color change.  Neurological: Negative for weakness.       Past Medical History:  Diagnosis Date   Coughing/wheezing 06/03/2019     Social History   Socioeconomic History   Marital status: Married    Spouse name: Not on file   Number of children: Not on file   Years of education: Not on file   Highest education level: Not on file  Occupational History   Not on file  Tobacco Use   Smoking status: Former Smoker   Smokeless tobacco: Never Used  Building services engineer Use: Never used  Substance and Sexual Activity   Alcohol use: Yes   Drug use: Never   Sexual activity: Not on file  Other Topics Concern   Not on file  Social History Narrative   Works as  a Financial risk analyst.   Married.   1 child.   Enjoys fishing, going to the [employer] of [employer] investment banker Strain: Not on file  Food Insecurity: Not on file  Transportation Needs: Not on file  Physical Activity: Not on file  Stress: Not on file  Social Connections: Not on file  Intimate Partner Violence: Not on file    Past Surgical History:  Procedure Laterality Date   MICRODISCECTOMY LUMBAR  2006    Family History  Problem Relation Age of Onset   Breast cancer Mother    Skin cancer Mother    Allergic rhinitis Mother    Prostate cancer Father 63   Thyroid cancer Father    Allergic rhinitis Father    Lung cancer Maternal Grandfather    Cancer Paternal Grandfather        Unclear   Eczema Sister    Angioedema Neg Hx    Asthma Neg Hx    Atopy Neg Hx    Immunodeficiency Neg Hx    Urticaria Neg Hx     No Known Allergies  Current Outpatient Medications on File Prior to Visit  Medication Sig Dispense Refill   albuterol (VENTOLIN HFA) 108 (90 Base) MCG/ACT inhaler Inhale 1-2 puffs into the lungs every 6 (six) hours as needed for wheezing or shortness  of breath. 18 g 2   azelastine (ASTELIN) 0.1 % nasal spray Place 1-2 sprays into both nostrils 2 (two) times daily. As needed. 30 mL 5   levocetirizine (XYZAL) 5 MG tablet Take 1 tablet (5 mg total) by mouth every evening. As needed. 30 tablet 5   No current facility-administered medications on file prior to visit.    BP 128/68    Pulse 79    Temp 97.6 F (36.4 C) (Temporal)    Ht 5\' 8"  (1.727 m)    Wt 207 lb (93.9 kg)    SpO2 96%    BMI 31.47 kg/m    Objective:   Physical Exam Musculoskeletal:        General: Swelling present. No tenderness or deformity.     Right knee: Swelling present. No erythema. Decreased range of motion. No tenderness. No ACL laxity.     Instability Tests: Anterior drawer test negative.       Legs:     Comments: Mild swelling to right medial anterior  knee. 5/5 strength bilaterally.   Skin:    General: Skin is warm and dry.     Findings: No bruising or erythema.            Assessment & Plan:

## 2020-07-07 NOTE — Patient Instructions (Signed)
Continue taking Ibuprofen and Tylenol for pain and inflammation.  Continue to elevate your knee when resting.  Try to walk some during the day to prevent stiffness. Consider trying a knee sleeve for support.   It was a pleasure to see you today!

## 2020-07-07 NOTE — Assessment & Plan Note (Addendum)
Since falling three days ago. Moderate improvement, per patient, with RICE and conservative treatment.   Exam today stable. Offered xray for which he kindly declines. He will update if symptoms do not continue to improve.  Continue RICE. Discussed the need for some mobility, recommended knee sleeve for support.  Refill provided for Ibuprofen 800 mg

## 2020-07-08 ENCOUNTER — Ambulatory Visit: Payer: 59 | Admitting: Primary Care

## 2020-09-07 ENCOUNTER — Encounter: Payer: Self-pay | Admitting: Primary Care

## 2020-09-07 ENCOUNTER — Ambulatory Visit (INDEPENDENT_AMBULATORY_CARE_PROVIDER_SITE_OTHER): Payer: 59 | Admitting: Primary Care

## 2020-09-07 ENCOUNTER — Other Ambulatory Visit: Payer: Self-pay

## 2020-09-07 VITALS — BP 126/72 | HR 75 | Temp 97.6°F | Ht 68.0 in | Wt 197.0 lb

## 2020-09-07 DIAGNOSIS — J4520 Mild intermittent asthma, uncomplicated: Secondary | ICD-10-CM | POA: Diagnosis not present

## 2020-09-07 DIAGNOSIS — Z1159 Encounter for screening for other viral diseases: Secondary | ICD-10-CM | POA: Diagnosis not present

## 2020-09-07 DIAGNOSIS — Z0000 Encounter for general adult medical examination without abnormal findings: Secondary | ICD-10-CM | POA: Diagnosis not present

## 2020-09-07 DIAGNOSIS — M25561 Pain in right knee: Secondary | ICD-10-CM

## 2020-09-07 DIAGNOSIS — Z114 Encounter for screening for human immunodeficiency virus [HIV]: Secondary | ICD-10-CM

## 2020-09-07 DIAGNOSIS — J3089 Other allergic rhinitis: Secondary | ICD-10-CM | POA: Diagnosis not present

## 2020-09-07 DIAGNOSIS — Z8042 Family history of malignant neoplasm of prostate: Secondary | ICD-10-CM

## 2020-09-07 DIAGNOSIS — Z808 Family history of malignant neoplasm of other organs or systems: Secondary | ICD-10-CM | POA: Diagnosis not present

## 2020-09-07 DIAGNOSIS — E785 Hyperlipidemia, unspecified: Secondary | ICD-10-CM | POA: Diagnosis not present

## 2020-09-07 LAB — LIPID PANEL
Cholesterol: 173 mg/dL (ref 0–200)
HDL: 34.2 mg/dL — ABNORMAL LOW (ref >39.00)
LDL Cholesterol: 108 mg/dL — ABNORMAL HIGH (ref 0–99)
NonHDL: 138.36
Total CHOL/HDL Ratio: 5
Triglycerides: 153 mg/dL — ABNORMAL HIGH (ref 0.0–149.0)
VLDL: 30.6 mg/dL (ref 0.0–40.0)

## 2020-09-07 LAB — COMPREHENSIVE METABOLIC PANEL
ALT: 54 U/L — ABNORMAL HIGH (ref 0–53)
AST: 35 U/L (ref 0–37)
Albumin: 4.6 g/dL (ref 3.5–5.2)
Alkaline Phosphatase: 84 U/L (ref 39–117)
BUN: 17 mg/dL (ref 6–23)
CO2: 29 mEq/L (ref 19–32)
Calcium: 10 mg/dL (ref 8.4–10.5)
Chloride: 104 mEq/L (ref 96–112)
Creatinine, Ser: 1.16 mg/dL (ref 0.40–1.50)
GFR: 78.12 mL/min (ref >60.00)
Glucose, Bld: 94 mg/dL (ref 70–99)
Potassium: 4.4 mEq/L (ref 3.5–5.1)
Sodium: 141 mEq/L (ref 135–145)
Total Bilirubin: 0.5 mg/dL (ref 0.2–1.2)
Total Protein: 7.4 g/dL (ref 6.0–8.3)

## 2020-09-07 LAB — CBC
HCT: 48.4 % (ref 39.0–52.0)
Hemoglobin: 16.7 g/dL (ref 13.0–17.0)
MCHC: 34.5 g/dL (ref 30.0–36.0)
MCV: 91 fl (ref 78.0–100.0)
Platelets: 218 10*3/uL (ref 150.0–400.0)
RBC: 5.32 Mil/uL (ref 4.22–5.81)
RDW: 12.6 % (ref 11.5–15.5)
WBC: 9.9 10*3/uL (ref 4.0–10.5)

## 2020-09-07 LAB — PSA: PSA: 1.1 ng/mL (ref 0.10–4.00)

## 2020-09-07 LAB — TSH: TSH: 1.29 u[IU]/mL (ref 0.35–4.50)

## 2020-09-07 NOTE — Assessment & Plan Note (Signed)
History of in Father.  Repeat TSH pending.

## 2020-09-07 NOTE — Assessment & Plan Note (Signed)
Discussed the importance of a healthy diet and regular exercise in order for weight loss, and to reduce the risk of any potential medical problems.  Repeat lipid panel pending. 

## 2020-09-07 NOTE — Assessment & Plan Note (Signed)
No recent symptoms, no recent use of albuterol inhaler. Continue to monitor.

## 2020-09-07 NOTE — Assessment & Plan Note (Signed)
Immunizations UTD. Discussed the importance of a healthy diet and regular exercise in order for weight loss, and to reduce the risk of any potential medical problems.  Exam today stable. Labs pending.

## 2020-09-07 NOTE — Assessment & Plan Note (Signed)
Diagnosed around age 40. PSA pending. No LUTS.

## 2020-09-07 NOTE — Patient Instructions (Signed)
Stop by the lab prior to leaving today. I will notify you of your results once received.   Start exercising. You should be getting 150 minutes of moderate intensity exercise weekly.  Continue to work on a healthy diet. Ensure you are consuming 64 ounces of water daily.  It was a pleasure to see you today!   Preventive Care 40-42 Years Old, Male Preventive care refers to lifestyle choices and visits with your health care provider that can promote health and wellness. This includes:  A yearly physical exam. This is also called an annual wellness visit.  Regular dental and eye exams.  Immunizations.  Screening for certain conditions.  Healthy lifestyle choices, such as: ? Eating a healthy diet. ? Getting regular exercise. ? Not using drugs or products that contain nicotine and tobacco. ? Limiting alcohol use. What can I expect for my preventive care visit? Physical exam Your health care provider will check your:  Height and weight. These may be used to calculate your BMI (body mass index). BMI is a measurement that tells if you are at a healthy weight.  Heart rate and blood pressure.  Body temperature.  Skin for abnormal spots. Counseling Your health care provider may ask you questions about your:  Past medical problems.  Family's medical history.  Alcohol, tobacco, and drug use.  Emotional well-being.  Home life and relationship well-being.  Sexual activity.  Diet, exercise, and sleep habits.  Work and work environment.  Access to firearms. What immunizations do I need? Vaccines are usually given at various ages, according to a schedule. Your health care provider will recommend vaccines for you based on your age, medical history, and lifestyle or other factors, such as travel or where you work.   What tests do I need? Blood tests  Lipid and cholesterol levels. These may be checked every 5 years, or more often if you are over 50 years old.  Hepatitis C  test.  Hepatitis B test. Screening  Lung cancer screening. You may have this screening every year starting at age 55 if you have a 30-pack-year history of smoking and currently smoke or have quit within the past 15 years.  Prostate cancer screening. Recommendations will vary depending on your family history and other risks.  Genital exam to check for testicular cancer or hernias.  Colorectal cancer screening. ? All adults should have this screening starting at age 50 and continuing until age 75. ? Your health care provider may recommend screening at age 45 if you are at increased risk. ? You will have tests every 1-10 years, depending on your results and the type of screening test.  Diabetes screening. ? This is done by checking your blood sugar (glucose) after you have not eaten for a while (fasting). ? You may have this done every 1-3 years.  STD (sexually transmitted disease) testing, if you are at risk. Follow these instructions at home: Eating and drinking  Eat a diet that includes fresh fruits and vegetables, whole grains, lean protein, and low-fat dairy products.  Take vitamin and mineral supplements as recommended by your health care provider.  Do not drink alcohol if your health care provider tells you not to drink.  If you drink alcohol: ? Limit how much you have to 0-2 drinks a day. ? Be aware of how much alcohol is in your drink. In the [place] one drink equals one 12 oz bottle of beer (355 mL), one 5 oz glass of wine (148 mL), or one   1 oz glass of hard liquor (44 mL).   Lifestyle  Take daily care of your teeth and gums. Brush your teeth every morning and night with fluoride toothpaste. Floss one time each day.  Stay active. Exercise for at least 30 minutes 5 or more days each week.  Do not use any products that contain nicotine or tobacco, such as cigarettes, e-cigarettes, and chewing tobacco. If you need help quitting, ask your health care provider.  Do not use  drugs.  If you are sexually active, practice safe sex. Use a condom or other form of protection to prevent STIs (sexually transmitted infections).  If told by your health care provider, take low-dose aspirin daily starting at age 71.  Find healthy ways to cope with stress, such as: ? Meditation, yoga, or listening to music. ? Journaling. ? Talking to a trusted person. ? Spending time with friends and family. Safety  Always wear your seat belt while driving or riding in a vehicle.  Do not drive: ? If you have been drinking alcohol. Do not ride with someone who has been drinking. ? When you are tired or distracted. ? While texting.  Wear a helmet and other protective equipment during sports activities.  If you have firearms in your house, make sure you follow all gun safety procedures. What's next?  Go to your health care provider once a year for an annual wellness visit.  Ask your health care provider how often you should have your eyes and teeth checked.  Stay up to date on all vaccines. This information is not intended to replace advice given to you by your health care provider. Make sure you discuss any questions you have with your health care provider. Document Revised: 04/09/2019 Document Reviewed: 07/05/2018 Elsevier Patient Education  2021 [employer].

## 2020-09-07 NOTE — Assessment & Plan Note (Signed)
Resolved

## 2020-09-07 NOTE — Progress Notes (Signed)
Subjective:    Patient ID: Jesus Carroll, male    DOB: 1980/09/16, 40 y.o.   MRN: [id number]  HPI  This visit occurred during the SARS-CoV-2 public health emergency.  Safety protocols were in place, including screening questions prior to the visit, additional usage of staff PPE, and extensive cleaning of exam room while observing appropriate contact time as indicated for disinfecting solutions.   Mr. Jesus Carroll is a 40 year old male who presents today for complete physical.  Immunizations: -Tetanus: 2019 -Influenza: Completed this season  -Covid-19: Completed 2 doses   Diet: He endorses a healthy diet.  Exercise: No regular exercise.   Eye exam: No recent exam Dental exam: No recent visit   BP Readings from Last 3 Encounters:  09/07/20 126/72  07/07/20 128/68  09/05/19 124/76     Review of Systems  Constitutional: Negative for unexpected weight change.  HENT: Negative for rhinorrhea.   Eyes: Negative for visual disturbance.  Respiratory: Negative for cough and shortness of breath.   Cardiovascular: Negative for chest pain.  Gastrointestinal: Negative for constipation and diarrhea.  Genitourinary: Negative for difficulty urinating.  Musculoskeletal: Negative for arthralgias.  Skin: Negative for rash.  Allergic/Immunologic: Negative for environmental allergies.  Neurological: Negative for dizziness, numbness and headaches.  Psychiatric/Behavioral: The patient is not nervous/anxious.        Past Medical History:  Diagnosis Date  . Coughing/wheezing 06/03/2019     Social History   Socioeconomic History  . Marital status: Married    Spouse name: Not on file  . Number of children: Not on file  . Years of education: Not on file  . Highest education level: Not on file  Occupational History  . Not on file  Tobacco Use  . Smoking status: Former Games developer  . Smokeless tobacco: Never Used  Vaping Use  . Vaping Use: Never used  Substance and Sexual Activity  . Alcohol  use: Yes  . Drug use: Never  . Sexual activity: Not on file  Other Topics Concern  . 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   Enjoys fishing, going to the [employer] of Corporate investment banker Strain: Not on file  Food Insecurity: Not on file  Transportation Needs: Not on file  Physical Activity: Not on file  Stress: Not on file  Social Connections: Not on file  Intimate Partner Violence: Not on file    Past Surgical History:  Procedure Laterality Date  . MICRODISCECTOMY LUMBAR  2006    Family History  Problem Relation Age of Onset  . Breast cancer Mother   . Skin cancer Mother   . Allergic rhinitis Mother   . Prostate cancer Father 51  . Thyroid cancer Father   . Allergic rhinitis Father   . Lung cancer Maternal Grandfather   . Cancer Paternal Grandfather        Unclear  . Eczema Sister   . Angioedema Neg Hx   . Asthma Neg Hx   . Atopy Neg Hx   . Immunodeficiency Neg Hx   . Urticaria Neg Hx     No Known Allergies  Current Outpatient Medications on File Prior to Visit  Medication Sig Dispense Refill  . albuterol (VENTOLIN HFA) 108 (90 Base) MCG/ACT inhaler Inhale 1-2 puffs into the lungs every 6 (six) hours as needed for wheezing or shortness of breath. 18 g 2  .  azelastine (ASTELIN) 0.1 % nasal spray Place 1-2 sprays into both nostrils 2 (two) times daily. As needed. 30 mL 5  . ibuprofen (ADVIL) 800 MG tablet Take 1 tablet (800 mg total) by mouth every 8 (eight) hours as needed for moderate pain. 30 tablet 0  . levocetirizine (XYZAL) 5 MG tablet Take 1 tablet (5 mg total) by mouth every evening. As needed. 30 tablet 5   No current facility-administered medications on file prior to visit.    BP 126/72   Pulse 75   Temp 97.6 F (36.4 C) (Temporal)   Ht 5\' 8"  (1.727 m)   Wt 197 lb (89.4 kg)   SpO2 98%   BMI 29.95 kg/m    Objective:   Physical Exam Constitutional:       Appearance: He is well-nourished.  HENT:     Right Ear: Tympanic membrane and ear canal normal.     Left Ear: Tympanic membrane and ear canal normal.     Mouth/Throat:     Mouth: Oropharynx is clear and moist.  Eyes:     Extraocular Movements: EOM normal.     Pupils: Pupils are equal, round, and reactive to light.  Cardiovascular:     Rate and Rhythm: Normal rate and regular rhythm.  Pulmonary:     Effort: Pulmonary effort is normal.     Breath sounds: Normal breath sounds.  Abdominal:     General: Bowel sounds are normal.     Palpations: Abdomen is soft.     Tenderness: There is no abdominal tenderness.  Musculoskeletal:        General: Normal range of motion.     Cervical back: Neck supple.  Skin:    General: Skin is warm and dry.  Neurological:     Mental Status: He is alert and oriented to person, place, and time.     Cranial Nerves: No cranial nerve deficit.     Deep Tendon Reflexes:     Reflex Scores:      Patellar reflexes are 2+ on the right side and 2+ on the left side. Psychiatric:        Mood and Affect: Mood and affect and mood normal.            Assessment & Plan:

## 2020-09-07 NOTE — Assessment & Plan Note (Signed)
Seasonal, typically in Spring/Summer.  Continue Xyzal 5 mg, Astelin nasal spray, albuterol PRN.   Continue to monitor.

## 2020-09-08 LAB — HEPATITIS C ANTIBODY
Hepatitis C Ab: NONREACTIVE
SIGNAL TO CUT-OFF: 0.01 (ref <1.00)

## 2020-09-08 LAB — HIV ANTIBODY (ROUTINE TESTING W REFLEX): HIV 1&2 Ab, 4th Generation: NONREACTIVE

## 2021-02-18 ENCOUNTER — Ambulatory Visit: Payer: 59 | Admitting: Primary Care

## 2021-02-18 ENCOUNTER — Other Ambulatory Visit: Payer: Self-pay

## 2021-02-18 ENCOUNTER — Encounter: Payer: Self-pay | Admitting: Primary Care

## 2021-02-18 DIAGNOSIS — M545 Low back pain, unspecified: Secondary | ICD-10-CM

## 2021-02-18 DIAGNOSIS — G8929 Other chronic pain: Secondary | ICD-10-CM | POA: Diagnosis not present

## 2021-02-18 MED ORDER — IBUPROFEN 800 MG PO TABS
ORAL_TABLET | ORAL | 0 refills | Status: DC
  Filled 2021-02-18: qty 30, 10d supply, fill #0

## 2021-02-18 MED ORDER — CYCLOBENZAPRINE HCL 5 MG PO TABS
5.0000 mg | ORAL_TABLET | Freq: Every evening | ORAL | 0 refills | Status: DC | PRN
  Filled 2021-02-18: qty 30, 30d supply, fill #0

## 2021-02-18 NOTE — Progress Notes (Signed)
Subjective:    Patient ID: Jesus Carroll, male    DOB: 18-Jan-1981, 40 y.o.   MRN: [id number]  HPI  Jesus Carroll is a very pleasant 40 y.o. male with a history of chronic low back pain, microdiscectomy to lumbar region in 2006 who presents today to discuss back pain.   His pain is located to the mid lower back which is chronic and intermittent. Three months ago he began to notice left/mid lower back "soreness" intermittent, but over the last month his pain has become constant and progressing. A few months ago he began to play golf more frequently for which he thinks may have provoked his pain.   A few days ago he began to experience significant pain when getting out of bed. Prolonged standing and sitting cause worsening symptoms. Some pain initially when getting into bed, when lifting his son and certain heavy objects.   He's been taking 800 mg once to twice daily for the last month with temporary improvement. He denies radiation of pain, numbness/tingling, changes in bowels.     Review of Systems  Musculoskeletal:  Positive for back pain.  Neurological:  Negative for weakness and numbness.        Past Medical History:  Diagnosis Date   Allergic conjunctivitis 06/03/2019   Coughing/wheezing 06/03/2019    Social History   Socioeconomic History   Marital status: Married    Spouse name: Not on file   Number of children: Not on file   Years of education: Not on file   Highest education level: Not on file  Occupational History   Not on file  Tobacco Use   Smoking status: Former   Smokeless tobacco: Never  Vaping Use   Vaping Use: Never used  Substance and Sexual Activity   Alcohol use: Yes   Drug use: Never   Sexual activity: Not on file  Other Topics Concern   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   Enjoys fishing, going to the [employer] of Corporate investment banker Strain: Not on file   Food Insecurity: Not on file  Transportation Needs: Not on file  Physical Activity: Not on file  Stress: Not on file  Social Connections: Not on file  Intimate Partner Violence: Not on file    Past Surgical History:  Procedure Laterality Date   MICRODISCECTOMY LUMBAR  2006    Family History  Problem Relation Age of Onset   Breast cancer Mother    Skin cancer Mother    Allergic rhinitis Mother    Prostate cancer Father 66   Thyroid cancer Father    Allergic rhinitis Father    Lung cancer Maternal Grandfather    Cancer Paternal Grandfather        Unclear   Eczema Sister    Angioedema Neg Hx    Asthma Neg Hx    Atopy Neg Hx    Immunodeficiency Neg Hx    Urticaria Neg Hx     No Known Allergies  Current Outpatient Medications on File Prior to Visit  Medication Sig Dispense Refill   albuterol (VENTOLIN HFA) 108 (90 Base) MCG/ACT inhaler Inhale 1-2 puffs into the lungs every 6 (six) hours as needed for wheezing or shortness of breath. 18 g 2   azelastine (ASTELIN) 0.1 % nasal spray Place 1-2 sprays into both nostrils 2 (two) times daily. As needed.  30 mL 5   ibuprofen (ADVIL) 800 MG tablet TAKE 1 TABLET (800 MG TOTAL) BY MOUTH EVERY 8 (EIGHT) HOURS AS NEEDED FOR MODERATE PAIN. 30 tablet 0   levocetirizine (XYZAL) 5 MG tablet Take 1 tablet (5 mg total) by mouth every evening. As needed. 30 tablet 5   No current facility-administered medications on file prior to visit.    BP 116/78   Pulse 67   Temp 97.7 F (36.5 C) (Temporal)   Ht 5\' 8"  (1.727 m)   Wt 195 lb (88.5 kg)   SpO2 98%   BMI 29.65 kg/m  Objective:   Physical Exam Musculoskeletal:     Lumbar back: No bony tenderness. Normal range of motion. Negative right straight leg raise test and negative left straight leg raise test.       Back:  Skin:    General: Skin is warm and dry.  Neurological:     Mental Status: He is alert.          Assessment & Plan:      This visit occurred during the  SARS-CoV-2 public health emergency.  Safety protocols were in place, including screening questions prior to the visit, additional usage of staff PPE, and extensive cleaning of exam room while observing appropriate contact time as indicated for disinfecting solutions.

## 2021-02-18 NOTE — Assessment & Plan Note (Signed)
Flare of chronic back pain x 3 months, worse recently. No alarm signs in HPI or on exam today.  Will update plain films of the lumbar spine. He will have this done at [facility].  Will trial cyclobenzaprine HS to help with muscle relaxation. Refill provided for Ibuprofen to use PRN. He is overall improving as of a few days ago.   Discussed to notify if no continued improvement. Consider PT vs orthopedic evaluation given history.

## 2021-02-18 NOTE — Patient Instructions (Signed)
Complete the xray at [facility].  You may take the cyclobenzaprine (Flexeril) muscle relaxer at bedtime for muscle spasms/sleep.  Do not exceed 2400 mg of Ibuprofen in 24 hours.  It was a pleasure to see you today!

## 2021-02-19 ENCOUNTER — Ambulatory Visit
Admission: RE | Admit: 2021-02-19 | Discharge: 2021-02-19 | Disposition: A | Discharge status: Home / Self Care | Payer: 59 | Source: Ambulatory Visit | Attending: Primary Care | Admitting: Primary Care

## 2021-02-19 DIAGNOSIS — G8929 Other chronic pain: Secondary | ICD-10-CM | POA: Insufficient documentation

## 2021-02-19 DIAGNOSIS — M5127 Other intervertebral disc displacement, lumbosacral region: Secondary | ICD-10-CM | POA: Insufficient documentation

## 2021-02-19 DIAGNOSIS — M545 Low back pain, unspecified: Secondary | ICD-10-CM | POA: Diagnosis not present

## 2021-02-19 DIAGNOSIS — M47816 Spondylosis without myelopathy or radiculopathy, lumbar region: Secondary | ICD-10-CM | POA: Insufficient documentation

## 2021-04-01 ENCOUNTER — Other Ambulatory Visit: Payer: Self-pay

## 2021-04-01 ENCOUNTER — Ambulatory Visit: Payer: 59 | Admitting: Primary Care

## 2021-04-01 ENCOUNTER — Encounter: Payer: Self-pay | Admitting: Primary Care

## 2021-04-01 VITALS — BP 108/74 | HR 76 | Temp 98.0°F | Ht 68.0 in | Wt 193.0 lb

## 2021-04-01 DIAGNOSIS — L237 Allergic contact dermatitis due to plants, except food: Secondary | ICD-10-CM

## 2021-04-01 HISTORY — DX: Allergic contact dermatitis due to plants, except food: L23.7

## 2021-04-01 MED ORDER — PREDNISONE 20 MG PO TABS
ORAL_TABLET | ORAL | 0 refills | Status: DC
  Filled 2021-04-01: qty 12, 8d supply, fill #0

## 2021-04-01 NOTE — Progress Notes (Signed)
Subjective:    Patient ID: Jesus Carroll, male    DOB: [birth year], 40 y.o.   MRN: [id number]  HPI  Jesus Carroll is a very pleasant 40 y.o. male who presents today to discuss rash.  His rash is located to his bilateral lower extremities including feet for the last two weeks. Prior to rash he was clearing out a bunch of brush in his yard. His rash is itchy, red, moving up his lower extremities.   He's used calamine lotion with temporary improvement. He denies wheezing, throat tightness, SOB.     Review of Systems  Respiratory:  Negative for shortness of breath and wheezing.   Skin:  Positive for rash.        Past Medical History:  Diagnosis Date   Allergic conjunctivitis 06/03/2019   Coughing/wheezing 06/03/2019    Social History   Socioeconomic History   Marital status: Married    Spouse name: Not on file   Number of children: Not on file   Years of education: Not on file   Highest education level: Not on file  Occupational History   Not on file  Tobacco Use   Smoking status: Former   Smokeless tobacco: Never  Vaping Use   Vaping Use: Never used  Substance and Sexual Activity   Alcohol use: Yes   Drug use: Never   Sexual activity: Not on file  Other Topics Concern   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   Enjoys fishing, going to the [employer] of [employer] investment banker Strain: Not on file  Food Insecurity: Not on file  Transportation Needs: Not on file  Physical Activity: Not on file  Stress: Not on file  Social Connections: Not on file  Intimate Partner Violence: Not on file    Past Surgical History:  Procedure Laterality Date   MICRODISCECTOMY LUMBAR  2006    Family History  Problem Relation Age of Onset   Breast cancer Mother    Skin cancer Mother    Allergic rhinitis Mother    Prostate cancer Father 40   Thyroid cancer Father    Allergic rhinitis Father     Lung cancer Maternal Grandfather    Cancer Paternal Grandfather        Unclear   Eczema Sister    Angioedema Neg Hx    Asthma Neg Hx    Atopy Neg Hx    Immunodeficiency Neg Hx    Urticaria Neg Hx     No Known Allergies  Current Outpatient Medications on File Prior to Visit  Medication Sig Dispense Refill   albuterol (VENTOLIN HFA) 108 (90 Base) MCG/ACT inhaler Inhale 1-2 puffs into the lungs every 6 (six) hours as needed for wheezing or shortness of breath. 18 g 2   azelastine (ASTELIN) 0.1 % nasal spray Place 1-2 sprays into both nostrils 2 (two) times daily. As needed. 30 mL 5   cyclobenzaprine (FLEXERIL) 5 MG tablet Take 1 tablet (5 mg total) by mouth at bedtime as needed for muscle spasms. 30 tablet 0   ibuprofen (ADVIL) 800 MG tablet TAKE 1 TABLET (800 MG TOTAL) BY MOUTH EVERY 8 (EIGHT) HOURS AS NEEDED FOR MODERATE PAIN. 30 tablet 0   levocetirizine (XYZAL) 5 MG tablet Take 1 tablet (5 mg total) by mouth every evening. As needed. 30 tablet 5   No current facility-administered medications  on file prior to visit.    BP 108/74   Pulse 76   Temp 98 F (36.7 C) (Temporal)   Ht 5\' 8"  (1.727 m)   Wt 193 lb (87.5 kg)   SpO2 99%   BMI 29.35 kg/m  Objective:   Physical Exam Cardiovascular:     Rate and Rhythm: Normal rate and regular rhythm.  Pulmonary:     Effort: Pulmonary effort is normal.     Breath sounds: No wheezing.  Skin:         Comments: Streaky rash to left lower extremity from ankle up to mid calf.  Dried vesicles to right medial ankle.   Neurological:     Mental Status: He is alert.          Assessment & Plan:      This visit occurred during the SARS-CoV-2 public health emergency.  Safety protocols were in place, including screening questions prior to the visit, additional usage of staff PPE, and extensive cleaning of exam room while observing appropriate contact time as indicated for disinfecting solutions.

## 2021-04-01 NOTE — Patient Instructions (Signed)
Start prednisone tablets. Take two tablets my mouth once daily for four days, then one tablet once daily for four days.   It was a pleasure to see you today!

## 2021-04-01 NOTE — Assessment & Plan Note (Signed)
Acute for 2 weeks, encounter poison ivy at home.   Exam today consistent for poison ivy dermatitis.  Given no resolve, will treat with prednisone 20 mg. Rx sent to pharmacy.  Return precautions provided.

## 2021-05-04 ENCOUNTER — Other Ambulatory Visit: Payer: Self-pay

## 2021-06-02 ENCOUNTER — Encounter: Payer: Self-pay | Admitting: Primary Care

## 2021-06-02 ENCOUNTER — Ambulatory Visit: Payer: 59 | Admitting: Primary Care

## 2021-06-02 ENCOUNTER — Other Ambulatory Visit: Payer: Self-pay

## 2021-06-02 VITALS — BP 118/80 | HR 78 | Temp 98.0°F | Ht 68.0 in | Wt 206.0 lb

## 2021-06-02 DIAGNOSIS — G8929 Other chronic pain: Secondary | ICD-10-CM | POA: Diagnosis not present

## 2021-06-02 DIAGNOSIS — M545 Low back pain, unspecified: Secondary | ICD-10-CM | POA: Diagnosis not present

## 2021-06-02 DIAGNOSIS — M25512 Pain in left shoulder: Secondary | ICD-10-CM | POA: Diagnosis not present

## 2021-06-02 MED ORDER — CYCLOBENZAPRINE HCL 5 MG PO TABS
5.0000 mg | ORAL_TABLET | Freq: Every evening | ORAL | 0 refills | Status: DC | PRN
  Filled 2021-06-02: qty 30, 30d supply, fill #0

## 2021-06-02 MED ORDER — MELOXICAM 15 MG PO TABS
15.0000 mg | ORAL_TABLET | Freq: Every day | ORAL | 0 refills | Status: DC | PRN
  Filled 2021-06-02: qty 30, 30d supply, fill #0

## 2021-06-02 NOTE — Assessment & Plan Note (Addendum)
Uncontrolled, persistent chronic back pain, worse recently in the past 6 months. No alarm signs on exam today.   Continue Cyclobenzaprine HS for muscle relaxation. Refill provided today.   Referral for Physical Therapy was placed today.   He will follow up via MyChart about his progress in PT. Consider orthopedic referral if needed as next step.  I evaluated patient, was consulted regarding treatment, and agree with assessment and plan per Kathaleen Maser, RN, DNP student.   Mayra Reel, NP-C

## 2021-06-02 NOTE — Progress Notes (Signed)
Subjective:    Patient ID: Jesus Carroll, male    DOB: 1981/04/26, 40 y.o.   MRN: [id number]  HPI  Jesus Carroll is a very pleasant 40 y.o. male with a history of chronic low back pain with a history who presents today to discuss acute left shoulder pain.  His pain is located to the lateral shoulder proximal to biceps muscle for which he noticed approximately 1 month ago.  His pain is worse with twisting motions to his arm, lifting anything heavy, posterior movement with his arm.  He denies injury/trauma, numbness/tingling, radiation of pain.  He is requesting a refill of his cyclobenzaprine for which he uses sparingly for his chronic back pain. He has been taking ibuprofen several times weekly for his chronic back pain.    Review of Systems  Musculoskeletal:  Positive for arthralgias and back pain.  Neurological:  Negative for weakness and numbness.        Past Medical History:  Diagnosis Date  . Allergic conjunctivitis 06/03/2019  . Coughing/wheezing 06/03/2019    Social History   Socioeconomic History  . Marital status: Married    Spouse name: Not on file  . Number of children: Not on file  . Years of education: Not on file  . Highest education level: Not on file  Occupational History  . Not on file  Tobacco Use  . Smoking status: Former  . Smokeless tobacco: Never  Vaping Use  . Vaping Use: Never used  Substance and Sexual Activity  . Alcohol use: Yes  . Drug use: Never  . Sexual activity: Not on file  Other Topics Concern  . 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   Enjoys fishing, going to the [employer] of Corporate investment banker Strain: Not on file  Food Insecurity: Not on file  Transportation Needs: Not on file  Physical Activity: Not on file  Stress: Not on file  Social Connections: Not on file  Intimate Partner Violence: Not on file    Past Surgical History:   Procedure Laterality Date  . MICRODISCECTOMY LUMBAR  2006    Family History  Problem Relation Age of Onset  . Breast cancer Mother   . Skin cancer Mother   . Allergic rhinitis Mother   . Prostate cancer Father 41  . Thyroid cancer Father   . Allergic rhinitis Father   . Lung cancer Maternal Grandfather   . Cancer Paternal Grandfather        Unclear  . Eczema Sister   . Angioedema Neg Hx   . Asthma Neg Hx   . Atopy Neg Hx   . Immunodeficiency Neg Hx   . Urticaria Neg Hx     No Known Allergies  Current Outpatient Medications on File Prior to Visit  Medication Sig Dispense Refill  . albuterol (VENTOLIN HFA) 108 (90 Base) MCG/ACT inhaler Inhale 1-2 puffs into the lungs every 6 (six) hours as needed for wheezing or shortness of breath. 18 g 2  . azelastine (ASTELIN) 0.1 % nasal spray Place 1-2 sprays into both nostrils 2 (two) times daily. As needed. 30 mL 5  . ibuprofen (ADVIL) 800 MG tablet TAKE 1 TABLET (800 MG TOTAL) BY MOUTH EVERY 8 (EIGHT) HOURS AS NEEDED FOR MODERATE PAIN. 30 tablet 0  . levocetirizine (XYZAL) 5 MG tablet Take 1 tablet (5 mg total) by mouth every  evening. As needed. 30 tablet 5   No current facility-administered medications on file prior to visit.    BP 118/80 (BP Location: Left Arm, Patient Position: Sitting, Cuff Size: Normal)   Pulse 78   Temp 98 F (36.7 C) (Temporal)   Ht 5\' 8"  (1.727 m)   Wt 206 lb (93.4 kg)   SpO2 99%   BMI 31.32 kg/m  Objective:   Physical Exam Cardiovascular:     Rate and Rhythm: Normal rate and regular rhythm.  Musculoskeletal:     Left shoulder: No tenderness. Decreased range of motion. Normal strength.     Comments: Mild decrease in range of motion with forward abduction and posterior abduction.  Negative empty can test.  5 out of 5 strength to bilateral upper extremities.  Neurological:     Mental Status: He is alert.          Assessment & Plan:      This visit occurred during the SARS-CoV-2 public  health emergency.  Safety protocols were in place, including screening questions prior to the visit, additional usage of staff PPE, and extensive cleaning of exam room while observing appropriate contact time as indicated for disinfecting solutions.

## 2021-06-02 NOTE — Progress Notes (Signed)
Established Patient Office Visit  Subjective:  Patient ID: Jesus Carroll, male    DOB: 21-Oct-1980  Age: 40 y.o. MRN: [id number]  CC: No chief complaint on file.   HPI Jesus Carroll is a 40 year old male with a past medical history of chronic midline low back pain without sciatica presents for acute shoulder pain and continued lower back pain.  He was last seen for his back pain on 02/18/21 for an acute flare. Plain films of the lumbar spine were obtained which were unremarkable and he trialed cyclobenzaprine at night as well as Ibuprofen as needed to help with muscle relaxation.   Back pain: Pt had discectomy in 2006 and hs had chronic lower back pain since. In the past year, the pain is becoming more problematic. No radiculopathy. He has been taking the cyclobenzaprine, and he reports as being helpful. Activities with bending and/or heavy lifting aggravate the pain. He also takes 800 mg of Ibuprofen approximately 3 times per week, which he also finds helpful. He also uses [employer] which is helpful.  Shoulder pain: Left shoulder pain onset one month ago at the proximal bicep muscle. He describes it as in intermittent, stabbing pain with activities that require external rotation. He rates the pain as 8/10. He did not have any precipitating traumatic event.    Past Medical History:  Diagnosis Date   Allergic conjunctivitis 06/03/2019   Coughing/wheezing 06/03/2019    Past Surgical History:  Procedure Laterality Date   MICRODISCECTOMY LUMBAR  2006    Family History  Problem Relation Age of Onset   Breast cancer Mother    Skin cancer Mother    Allergic rhinitis Mother    Prostate cancer Father 27   Thyroid cancer Father    Allergic rhinitis Father    Lung cancer Maternal Grandfather    Cancer Paternal Grandfather        Unclear   Eczema Sister    Angioedema Neg Hx    Asthma Neg Hx    Atopy Neg Hx    Immunodeficiency Neg Hx    Urticaria Neg Hx     Social History    Socioeconomic History   Marital status: Married    Spouse name: Not on file   Number of children: Not on file   Years of education: Not on file   Highest education level: Not on file  Occupational History   Not on file  Tobacco Use   Smoking status: Former   Smokeless tobacco: Never  Vaping Use   Vaping Use: Never used  Substance and Sexual Activity   Alcohol use: Yes   Drug use: Never   Sexual activity: Not on file  Other Topics Concern   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   Enjoys fishing, going to the [employer] of Corporate investment banker Strain: Not on file  Food Insecurity: Not on file  Transportation Needs: Not on file  Physical Activity: Not on file  Stress: Not on file  Social Connections: Not on file  Intimate Partner Violence: Not on file    Outpatient Medications Prior to Visit  Medication Sig Dispense Refill   albuterol (VENTOLIN HFA) 108 (90 Base) MCG/ACT inhaler Inhale 1-2 puffs into the lungs every 6 (six) hours as needed for wheezing or shortness of breath. 18 g 2   azelastine (ASTELIN) 0.1 % nasal  spray Place 1-2 sprays into both nostrils 2 (two) times daily. As needed. 30 mL 5   cyclobenzaprine (FLEXERIL) 5 MG tablet Take 1 tablet (5 mg total) by mouth at bedtime as needed for muscle spasms. 30 tablet 0   ibuprofen (ADVIL) 800 MG tablet TAKE 1 TABLET (800 MG TOTAL) BY MOUTH EVERY 8 (EIGHT) HOURS AS NEEDED FOR MODERATE PAIN. 30 tablet 0   levocetirizine (XYZAL) 5 MG tablet Take 1 tablet (5 mg total) by mouth every evening. As needed. 30 tablet 5   predniSONE (DELTASONE) 20 MG tablet Take two tablets my mouth once daily for four days, then one tablet once daily for four days. 12 tablet 0   No facility-administered medications prior to visit.    No Known Allergies  ROS Review of Systems  Constitutional: Negative.   Musculoskeletal: Negative.   Skin:  Negative for color  change and wound.  Neurological: Negative.   Psychiatric/Behavioral: Negative.       Objective:    Physical Exam Constitutional:      Appearance: Normal appearance.  HENT:     Head: Normocephalic.  Cardiovascular:     Heart sounds: Normal heart sounds.  Pulmonary:     Breath sounds: Normal breath sounds.  Musculoskeletal:     Right shoulder: Tenderness present.     Left shoulder: Tenderness present. No swelling, deformity or crepitus. Decreased range of motion. Normal strength.     Cervical back: Normal range of motion and neck supple. Normal range of motion.     Comments: Decreased ROM with external rotation and flexion. Positive Gerber lift-off test and isometric external rotation test  Drop arm test negative   Neurological:     General: No focal deficit present.     Mental Status: He is alert and oriented to person, place, and time. Mental status is at baseline.     Sensory: No sensory deficit.  Psychiatric:        Mood and Affect: Mood normal.        Behavior: Behavior normal.    There were no vitals taken for this visit. Wt Readings from Last 3 Encounters:  04/01/21 193 lb (87.5 kg)  02/18/21 195 lb (88.5 kg)  09/07/20 197 lb (89.4 kg)     Health Maintenance Due  Topic Date Due   COVID-19 Vaccine (4 - Booster for Pfizer series) 07/10/2020    There are no preventive care reminders to display for this patient.  Lab Results  Component Value Date   TSH 1.29 09/07/2020   Lab Results  Component Value Date   WBC 9.9 09/07/2020   HGB 16.7 09/07/2020   HCT 48.4 09/07/2020   MCV 91.0 09/07/2020   PLT 218.0 09/07/2020   Lab Results  Component Value Date   NA 141 09/07/2020   K 4.4 09/07/2020   CO2 29 09/07/2020   GLUCOSE 94 09/07/2020   BUN 17 09/07/2020   CREATININE 1.16 09/07/2020   BILITOT 0.5 09/07/2020   ALKPHOS 84 09/07/2020   AST 35 09/07/2020   ALT 54 (H) 09/07/2020   PROT 7.4 09/07/2020   ALBUMIN 4.6 09/07/2020   CALCIUM 10.0 09/07/2020    ANIONGAP 10 10/23/2019   GFR 78.12 09/07/2020   Lab Results  Component Value Date   CHOL 173 09/07/2020   Lab Results  Component Value Date   HDL 34.20 (L) 09/07/2020   Lab Results  Component Value Date   LDLCALC 108 (H) 09/07/2020   Lab Results  Component Value Date  TRIG 153.0 (H) 09/07/2020   Lab Results  Component Value Date   CHOLHDL 5 09/07/2020   Lab Results  Component Value Date   HGBA1C 5.5 12/26/2018      Assessment & Plan:   Problem List Items Addressed This Visit   None   No orders of the defined types were placed in this encounter.   Follow-up: No follow-ups on file.    Devoria Glassing, RN

## 2021-06-02 NOTE — Patient Instructions (Signed)
Take 1 tablet by mouth of 15 mg Meloxicam for your left shoulder pain.  Do not take Ibuprofen while taking Meloxicam.  You will be contacted regarding your referral to physical therapy .  Please let us know if you have not been contacted within two weeks.

## 2021-06-02 NOTE — Assessment & Plan Note (Addendum)
Suspect musculoskeletal etiology, possibly rotator cuff involvement.   Start 15 mg Meloxicam, once daily 15 mg tablet. We discussed discontinuation of Ibuprofen while taking this.   Referral to Physical Therapy was placed. Consider orthopedic referral as next step.   He will follow up via MyChart.  I evaluated patient, was consulted regarding treatment, and agree with assessment and plan per Kathaleen Maser, RN, DNP student.   Mayra Reel, NP-C

## 2021-07-27 ENCOUNTER — Other Ambulatory Visit: Payer: Self-pay

## 2021-07-27 ENCOUNTER — Ambulatory Visit: Payer: 59 | Attending: Primary Care

## 2021-07-27 VITALS — BP 110/82 | HR 75 | Ht 68.0 in | Wt 199.0 lb

## 2021-07-27 DIAGNOSIS — M545 Low back pain, unspecified: Secondary | ICD-10-CM

## 2021-07-27 DIAGNOSIS — M25619 Stiffness of unspecified shoulder, not elsewhere classified: Secondary | ICD-10-CM | POA: Diagnosis not present

## 2021-07-27 DIAGNOSIS — M25512 Pain in left shoulder: Secondary | ICD-10-CM | POA: Diagnosis not present

## 2021-07-27 DIAGNOSIS — M6281 Muscle weakness (generalized): Secondary | ICD-10-CM | POA: Diagnosis not present

## 2021-07-27 DIAGNOSIS — G8929 Other chronic pain: Secondary | ICD-10-CM

## 2021-07-27 NOTE — Therapy (Signed)
[facility] MAIN [facility] SERVICES [address] [place], [place], [address] Phone: [phone]   Fax:  [phone]  Physical Therapy Evaluation  Patient Details  Name: Jesus Carroll MRN: [id number] Date of Birth: 1981-01-17 Referring Provider (PT): Vernona Rieger, NP   Encounter Date: 07/27/2021   PT End of Session - 07/27/21 2310     Visit Number 1    Number of Visits 25    Date for PT Re-Evaluation 10/19/21    Authorization Type Initial Certification 07/27/2021- 10/19/2021    Progress Note Due on Visit 10    PT Start Time 1125    PT Stop Time 1225    PT Time Calculation (min) 60 min    Activity Tolerance Patient tolerated treatment well    Behavior During Therapy [facility] for tasks assessed/performed             Past Medical History:  Diagnosis Date   Allergic conjunctivitis 06/03/2019   Coughing/wheezing 06/03/2019    Past Surgical History:  Procedure Laterality Date   MICRODISCECTOMY LUMBAR  2006    Vitals:   07/27/21 1135  BP: 110/82  Pulse: 75  Weight: 199 lb (90.3 kg)  Height: 5\' 8"  (1.727 m)      Subjective Assessment - 07/27/21 1141     Subjective Patient reports having chronic low back pain since his microdisectomy in 2006 but progressively worse over past several months and states having some acute left shoulder pain with difficulty raisisng his left arm.    Pertinent History PER MD NOTE FROM 06/02/2021 submitted by 13/03/2021, NP- BENCE TRAPP is a 41 year old male with a past medical history of chronic midline low back pain without sciatica presents for acute shoulder pain and continued lower back pain.   He was last seen for his back pain on 02/18/21 for an acute flare. Plain films of the lumbar spine were obtained which were unremarkable and he trialed cyclobenzaprine at night as well as Ibuprofen as needed to help with muscle relaxation.      Back pain: Pt had discectomy in 2006 and hs had chronic lower back  pain since. In the past year, the pain is becoming more problematic. No radiculopathy. He has been taking the cyclobenzaprine, and he reports as being helpful. Activities with bending and/or heavy lifting aggravate the pain. He also takes 800 mg of Ibuprofen approximately 3 times per week, which he also finds helpful. He also uses 2007 which is helpful.     Shoulder pain: Left shoulder pain onset one month ago at the proximal bicep muscle. He describes it as in intermittent, stabbing pain with activities that require external rotation. He rates the pain as 8/10. He did not have any precipitating traumatic event.    Limitations Sitting;Lifting;Standing;House hold activities;Walking    How long can you sit comfortably? 1 hour    How long can you stand comfortably? 1 hour    How long can you walk comfortably? 40 min    Diagnostic tests 02/2021-IMPRESSION:  No acute osseous abnormality.     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    Patient Stated Goals Full ROM for my shoulder as painfree as possible and improve my stiffness and pain in my back    Currently in Pain? Yes   low back and left shoulder   Pain Score 7     Pain Location Back    Pain Orientation Left;Posterior    Pain  Descriptors / Indicators Aching;Tightness    Pain Type Chronic pain    Pain Onset More than a month ago    Pain Frequency Constant    Aggravating Factors  Lifting, Raising my arm > 90 deg, Pulling, Physical work or playing with my son    Pain Relieving Factors Meds                [facility] PT Assessment - 07/27/21 1137       Assessment   Medical Diagnosis Low back pain; Left shoulder pain    Referring Provider (PT) Vernona Rieger, NP    Onset Date/Surgical Date --   approx 8 months ago   Hand Dominance Right    Next MD Visit none      Precautions   Precautions None      Restrictions   Weight Bearing Restrictions No      Balance Screen   Has the patient fallen in the past 6 months  No    Has the patient had a decrease in activity level because of a fear of falling?  No    Is the patient reluctant to leave their home because of a fear of falling?  No      Home Nurse, mental health Private residence    Living Arrangements Spouse/significant other;Children    Available Help at Discharge Family;Friend(s)    Type of Home House    Home Access Stairs to enter      Prior Function   Level of Independence Independent    Vocation Full time employment      Cognition   Overall Cognitive Status Within Functional Limits for tasks assessed    Attention Focused    Focused Attention Appears intact      Observation/Other Assessments   Focus on Therapeutic Outcomes (FOTO)  53               OBJECTIVE  MUSCULOSKELETAL: Tremor: Normal Bulk: Normal Tone: Normal  Cervical Screen AROM: WFL and painless with overpressure in all planes Elbow Screen Elbow AROM: Within Normal Limits  Palpation Tenderness  with palpation to  posterior, lateral, and superior shoulder Tenderness noted with left sided low back pain  Strength R/L 5/2- Shoulder flexion (anterior deltoid/pec major/coracobrachialis, axillary n. (C5-6) and musculocutaneous n. (C5-7)) 5/2- Shoulder abduction (deltoid/supraspinatus, axillary/suprascapular n, C5) 5/4 Shoulder external rotation (infraspinatus/teres minor) 5/5 Shoulder internal rotation (subcapularis/lats/pec major) 5/5 Shoulder extension (posterior deltoid, lats, teres major, axillary/thoracodorsal n.) 5/5 Shoulder horizontal abduction 5/5 Elbow flexion (biceps brachii, brachialis, brachioradialis, musculoskeletal n, C5-6) 5/5 Elbow extension (triceps, radial n, C7) 5/5 Wrist Extension 5/5 Wrist Flexion 5/5 Finger adduction (interossei, ulnar n, T1)  AROM R/L WNL/132* deg Shoulder flexion 180/ (pain starting at 65 *deg and limited to 85 deg L Shoulder abduction 90/70* Shoulder external rotation 70/70 Shoulder internal  rotation 60/60 Shoulder extension *Indicates pain, overpressure performed unless otherwise indicated  PROM R/L WNL/170 Shoulder flexion 180/170* Shoulder abduction 90/90 Shoulder external rotation 70/70 Shoulder internal rotation 60/60 Shoulder extension *Indicates pain, overpressure performed unless otherwise indicated   NEUROLOGICAL:  Mental Status Patient is oriented to person, place and time.  Recent memory is intact.  Remote memory is intact.  Attention span and concentration are intact.  Expressive speech is intact.  Patient's fund of knowledge is within normal limits for educational level.  Sensation Grossly intact to light touch bilateral UE as determined by testing dermatomes C2-T2 Proprioception and hot/cold testing deferred on this date   SPECIAL TESTS  Rotator Cuff   Painful Arc (Pain from 60 to 120 degrees scaption): Positive Infraspinatus Muscle Test: Positive  Subacromial Impingement Hawkins-Kennedy: Positive Neer (Block scapula, PROM flexion): Negative Painful Arc (Pain from 60 to 120 degrees scaption): Positive Empty Can: Positive External Rotation Resistance: Positive     Bicep Tendon Pathology Speed (shoulder flexion to 90, external rotation, full elbow extension, and forearm supination with resistance: Positive Yergason's (resisted shoulder ER and supination/biceps tendon pathology): Negative     Outcome Measures  Quick DASH: 29.5  Gait  WNL- No gait abnormalities  Posture Lumbar lordosis: WNL Iliac crest height: equal bilaterally Lumbar lateral shift: negative Lower crossed syndrome (tight hip flexors and erector spinae; weak gluts and abs): negative   AROM (degrees) R/L (all movements include overpressure unless otherwise stated) Lumbar forward flexion: WNL  Lumbar extension (30): WNL Lumbar lateral flexion (25): R: fingertip below knees L:fingertip below knees      Repeated Movements No centralization or  peripheralization of symptoms with repeated lumbar extension or flexion.    Strength (out of 5) R/L 5/5 Hip flexion 5/5 Hip ER 5/5 Hip IR 5/5 Hip abduction 5/5 Hip adduction 5/5 Hip extension 5/5 Knee extension 5/5 Knee flexion 5/5 Ankle dorsiflexion 5/5 Ankle plantarflexion 5 Trunk flexion 5 Trunk extension 5/5 Trunk rotation *Indicates pain   Sensation Grossly intact to light touch bilateral LEs as determined by testing dermatomes L2-S2. Proprioception and hot/cold testing deferred on this date.     Palpation Graded on 0-4 scale (0 = no pain, 1 = pain, 2 = pain with wincing/grimacing/flinching, 3 = pain with withdrawal, 4 = unwilling to allow palpation) Location LEFT  RIGHT           Lumbar paraspinals 1 0  Quadratus Lumborum 0 0  Gluteus Maximus 0 0  Gluteus Medius 0 0  Deep hip external rotators 0 0  PSIS 0 0  Fortin's Area (SIJ) 0 0  Greater Trochanter 0 0     Muscle Length Hamstrings: R: approx 80 degrees L:  around 50 degrees     ASSESSMENT Pt is a pleasant 41 year-old male referred for low back pain and left shoulder pain. PT examination reveals deficits including pain, Limited left shoulder ROM and UE weakness, tightness in low back/hips/hamstrings . Pt will benefit from PT services to address deficits in strength, mobility, and pain in order to return to full function at home and work with less low back and left shoulder pain.                                   Access Code: [email or web address] URL: [email or web address] Date: 07/28/2021 Prepared by: Maureen RalphsJeffrey Calyn Rubi  Exercises Supine Hamstring Stretch with Strap - 1 x daily - 7 x weekly - 3 sets - 20-30 sec hold Supine Lower Trunk Rotation - 1 x daily - 7 x weekly - 3 sets - 20-30 sec hold Supine Figure 4 Piriformis Stretch - 1 x daily - 7 x weekly - 3 sets - 20-30 hold Seated Figure 4 Piriformis Stretch - 1 x daily - 7 x weekly - 3 sets - 20-30 hold Supine Single  Knee to Chest Stretch - 1 x daily - 7 x weekly - 3 sets - 20-30 hold Supine Shoulder Abduction AAROM with Dowel - 1 x daily - 7 x weekly - 3 sets - 10 reps            PT  Education - 07/27/21 2309     Education Details PT Plan of care; Exercise technique for HEP    Person(s) Educated Patient    Methods Explanation    Comprehension Verbalized understanding;Verbal cues required;Tactile cues required;Need further instruction              PT Short Term Goals - 07/28/21 1413       PT SHORT TERM GOAL #1   Title Pt will be independent with HEP in order to improve strength and decrease pain in order to improve pain-free function at home and work.    Baseline 07/27/2021- Patient presents with no formal HEP in place.    Time 6    Period Weeks    Status New    Target Date 09/07/21               PT Long Term Goals - 07/28/21 1424       PT LONG TERM GOAL #1   Title Pt will improve FOTO to target score of 64 to display perceived improvements in ability to complete ADL's.    Baseline 07/27/2021=53    Time 12    Period Weeks    Status New    Target Date 10/19/21      PT LONG TERM GOAL #2   Title Pt will decrease quick DASH score by at least 8% in order to demonstrate clinically significant reduction in disability.    Baseline 07/27/2021= 29.5 %    Time 12    Period Weeks    Status New    Target Date 10/19/21      PT LONG TERM GOAL #3   Title Pt will decrease worst pain as reported on NPRS by at least 3 points in order to demonstrate clinically significant reduction in low back and left shoudler pain.    Baseline 07/27/2021= 7/10 reported LPB and left shoulder pain    Time 12    Period Weeks    Status New    Target Date 10/19/21      PT LONG TERM GOAL #4   Title Pt will increase Left UE strength of  by at least 1/2 MMT grade in order to demonstrate improvement in strength and function    Baseline 07/27/2021= 2-/5 left shoulder flex/abd    Time 12    Period Weeks     Status New    Target Date 10/19/21                    Plan - 07/27/21 2316     Clinical Impression Statement Pt is a pleasant 41 year-old male referred for low back pain and left shoulder pain. PT examination reveals deficits including pain, Limited left shoulder ROM and UE weakness, tightness in low back/hips/hamstrings . Pt will benefit from PT services to address deficits in strength, mobility, and pain in order to return to full function at home and work with less low back and left shoulder pain.    Examination-Activity Limitations Bend;Carry;Lift;Stand;Squat;Reach Overhead    Examination-Participation Restrictions Cleaning;Community Activity;Yard Work    Conservation officer, historic buildingstability/Clinical Decision Making Evolving/Moderate complexity    Clinical Decision Making Moderate    Rehab Potential Good    PT Frequency 2x / week    PT Duration 12 weeks    PT Treatment/Interventions ADLs/Self Care Home Management;Cryotherapy;Electrical Stimulation;Moist Heat;Traction;Stair training;Gait training;Functional mobility training;Therapeutic activities;Therapeutic exercise;Balance training;Neuromuscular re-education;Manual techniques;Passive range of motion;Patient/family education;Dry needling;Spinal Manipulations;Joint Manipulations  Patient will benefit from skilled therapeutic intervention in order to improve the following deficits and impairments:  Decreased activity tolerance, Decreased endurance, Decreased strength, Decreased range of motion, Hypomobility, Impaired UE functional use, Pain  Visit Diagnosis: Muscle weakness (generalized)  Chronic left shoulder pain  Chronic left-sided low back pain without sciatica  Limited range of motion (ROM) of shoulder     Problem List Patient Active Problem List   Diagnosis Date Noted   Shoulder pain, left 06/02/2021   Poison ivy dermatitis 04/01/2021   Family history of thyroid cancer 09/07/2020   Family history of prostate cancer in  father 09/07/2020   Chronic midline low back pain without sciatica 07/07/2020   Acute knee pain 07/07/2020   Hyperlipidemia 09/05/2019   Other allergic rhinitis 06/03/2019   Coughing/wheezing 06/03/2019   Preventative health care 12/26/2018    Lenda Kelp, PT 07/28/2021, 2:47 PM  [facility] MAIN [facility] SERVICES [address] [place], [place], [address] Phone: [phone]   Fax:  [phone]  Name: Jesus Carroll MRN: [id number] Date of Birth: 09-18-1980

## 2021-08-03 ENCOUNTER — Ambulatory Visit: Payer: 59

## 2021-08-10 ENCOUNTER — Ambulatory Visit: Payer: 59 | Admitting: Physical Therapy

## 2021-08-10 ENCOUNTER — Other Ambulatory Visit: Payer: Self-pay

## 2021-08-10 ENCOUNTER — Encounter: Payer: Self-pay | Admitting: Physical Therapy

## 2021-08-10 DIAGNOSIS — M545 Low back pain, unspecified: Secondary | ICD-10-CM

## 2021-08-10 DIAGNOSIS — M6281 Muscle weakness (generalized): Secondary | ICD-10-CM | POA: Diagnosis not present

## 2021-08-10 DIAGNOSIS — M25619 Stiffness of unspecified shoulder, not elsewhere classified: Secondary | ICD-10-CM

## 2021-08-10 DIAGNOSIS — G8929 Other chronic pain: Secondary | ICD-10-CM

## 2021-08-10 DIAGNOSIS — M25512 Pain in left shoulder: Secondary | ICD-10-CM

## 2021-08-10 NOTE — Patient Instructions (Signed)
Access Code: [email or web address] URL: [email or web address] Date: 08/10/2021 Prepared by: Zettie Pho  Exercises Supine Posterior Pelvic Tilt - 1 x daily - 7 x weekly - 1 sets - 10 reps - 3-5 sec hold Supine March with Posterior Pelvic Tilt - 1 x daily - 7 x weekly - 1 sets - 10 reps

## 2021-08-10 NOTE — Therapy (Signed)
[place] [facility] [address] [place], [place], [address] Phone: [phone]   Fax:  [phone]  Physical Therapy Treatment  Patient Details  Name: Jesus MaineStephen F Dusza MRN: [id number] Date of Birth: 10-24-1980 Referring Provider (PT): Vernona RiegerKatherine Clark, NP   Encounter Date: 08/10/2021   PT End of Session - 08/10/21 1128     Visit Number 2    Number of Visits 25    Date for PT Re-Evaluation 10/19/21    Authorization Type Initial Certification 07/27/2021- 10/19/2021    Progress Note Due on Visit 10    PT Start Time 0849    PT Stop Time 0932    PT Time Calculation (min) 43 min    Activity Tolerance Patient tolerated treatment well    Behavior [facility]/per[facility]             Past Medical History:  Diagnosis Date   Allergic conjunctivitis 06/03/2019   Coughing/wheezing 06/03/2019    Past Surgical History:  Procedure Laterality Date   MICRODISCECTOMY LUMBAR  2006    There were no vitals filed for this visit.   Subjective Assessment - 08/10/21 0852     Subjective Patient reports increased back pain this weekend. He reports increased soreness with lifting- especially when picking up backpack or child (41 year old); did take ibuprofen this morning. He reports increased flexibility in left shoulder but is still having trouble lifting; Reports HEP is going well but is having trouble with piriformis stretch in supine;    Pertinent History PER MD NOTE FROM 06/02/2021 submitted by Vernona RiegerKatherine Clark, NP- Jesus MaineStephen F Ridge is a 41 year old male with a past medical history of chronic midline low back pain without sciatica presents for acute shoulder pain and continued lower back pain.   He was last seen for his back pain on 02/18/21 for an acute flare. Plain films of the lumbar spine were obtained which were unremarkable and he trialed cyclobenzaprine at night as well as Ibuprofen as needed to help with muscle  relaxation.      Back pain: Pt had discectomy in 2006 and hs had chronic lower back pain since. In the past year, the pain is becoming more problematic. No radiculopathy. He has been taking the cyclobenzaprine, and he reports as being helpful. Activities with bending and/or heavy lifting aggravate the pain. He also takes 800 mg of Ibuprofen approximately 3 times per week, which he also finds helpful. He also uses [employer] which is helpful.     Shoulder pain: Left shoulder pain onset one month ago at the proximal bicep muscle. He describes it as in intermittent, stabbing pain with activities that require external rotation. He rates the pain as 8/10. He did not have any precipitating traumatic event.    Limitations Sitting;Lifting;Standing;House hold activities;Walking    How long can you sit comfortably? 1 hour    How long can you stand comfortably? 1 hour    How long can you walk comfortably? 40 min    Diagnostic tests 02/2021-IMPRESSION:  No acute osseous abnormality.     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    Patient Stated Goals Full ROM for my shoulder as painfree as possible and improve my stiffness and pain in my back    Currently in Pain? Yes    Pain Score 4     Pain Location Back    Pain Orientation Lower;Left    Pain Descriptors /  Indicators Aching;Sore    Pain Type Chronic pain    Pain Onset More than a month ago    Pain Frequency Intermittent    Aggravating Factors  lifting    Pain Relieving Factors meds/rest    Effect of Pain on Daily Activities decreased lifting tolerance;    Multiple Pain Sites No              TREATMENT: PT identified increased tightness/trigger point along QL around SI joint on left side;  Patient prone: PT assessed lumbar joints with central PA mobs, hypomobility throughout with pain reported at L1, L3; He reports pain radiating down lumbar spine with PA mobs at L1, pain at L3 was localized to L3;  PT identified increased  stiffness at left SI joint with increased prominence; PT performed grade II-III PA mobs to left SI joint 15 sec bouts x2 sets with moderate tenderness reported;  In prone, instructed patient in alternate hamstring curl AROM x10 reps each with no pain reports Instructed patient in hip extension x5 reps each LE with decreased left side multifidi activation likely contributing to low back pain with decreased stabilization;   Patient transitioned to hooklying position:  Hooklying  Posterior pelvic tilt: 3 sec hold x10 reps with min VCS for proper positioning/exercise technique;  Posterior pelvic tilt with alternate march x10 reps each LE with cues for increased core stabilization, slow down movement for better motor control;   Advanced HEP- see patient instructions;  Instructed patient in LUE shoulder stabilization with 2# scapular protraction (punch) x10 reps; When raising LUE overhead, he reports tenderness along superior border/supraspinatus.   PT performed grade II-III inferior joint mobs to LUE glenohumeral joint with good tolerance 15 sec bouts x3 sets;  Denies any tenderness posteriorly/anterior and no tenderness or pain along AC joint;  Patient tolerated session well. He reports mild soreness with advanced exercise. Overall he exhibits improvement in shoulder ROM since evaluation. Would benefit from dry needling to address trigger point to left low back;                          PT Education - 08/10/21 1128     Education Details ROM/positioning; dry needling; HEP    Person(s) Educated Patient    Methods Explanation;Verbal cues    Comprehension Verbalized understanding;Returned demonstration;Verbal cues required;Need further instruction              PT Short Term Goals - 07/28/21 1413       PT SHORT TERM GOAL #1   Title Pt will be independent with HEP in order to improve strength and decrease pain in order to improve pain-free function at home and work.     Baseline 07/27/2021- Patient presents with no formal HEP in place.    Time 6    Period Weeks    Status New    Target Date 09/07/21               PT Long Term Goals - 07/28/21 1424       PT LONG TERM GOAL #1   Title Pt will improve FOTO to target score of 64 to display perceived improvements in ability to complete ADL's.    Baseline 07/27/2021=53    Time 12    Period Weeks    Status New    Target Date 10/19/21      PT LONG TERM GOAL #2   Title Pt will decrease quick DASH score by at  least 8% in order to demonstrate clinically significant reduction in disability.    Baseline 07/27/2021= 29.5 %    Time 12    Period Weeks    Status New    Target Date 10/19/21      PT LONG TERM GOAL #3   Title Pt will decrease worst pain as reported on NPRS by at least 3 points in order to demonstrate clinically significant reduction in low back and left shoudler pain.    Baseline 07/27/2021= 7/10 reported LPB and left shoulder pain    Time 12    Period Weeks    Status New    Target Date 10/19/21      PT LONG TERM GOAL #4   Title Pt will increase Left UE strength of  by at least 1/2 MMT grade in order to demonstrate improvement in strength and function    Baseline 07/27/2021= 2-/5 left shoulder flex/abd    Time 12    Period Weeks    Status New    Target Date 10/19/21                   Plan - 08/10/21 1128     Clinical Impression Statement Patient motivated and participated well within session. He does report increased tenderness with possible" knot" along posterior left low back. PT identified trigger point with soft tissue along left SI joint. Consider dry needling in future to address trigger point. PT assessed lumbar spine with central PA mobs. He does have stiffness throughout and pain at L1 and L3. Patient also exhibits stiffness with gentle joint mobs to left SI joint. Patient reports increased soreness after transitoning to supine position. He was instructed in core  stabilization exercise to improve trunk stability. He does require min VCs for proper execise technique. Patient also educated in LUE shoulder stabilization exercise. He reports superior left shoulder pain when lifting arm overhead along supraspinatus. This was alleviated with gentle inferior glenohumeral joint mobilizations. He would benefit from additional skilled PT Intervention to improve strength, ROM and reduce pain with ADLs.    Examination-Activity Limitations Bend;Carry;Lift;Stand;Squat;Reach Overhead    Examination-Participation Restrictions Cleaning;Community Activity;Yard Work    Conservation officer, historic buildings Evolving/Moderate complexity    Rehab Potential Good    PT Frequency 2x / week    PT Duration 12 weeks    PT Treatment/Interventions ADLs/Self Care Home Management;Cryotherapy;Electrical Stimulation;Moist Heat;Traction;Stair training;Gait training;Functional mobility training;Therapeutic activities;Therapeutic exercise;Balance training;Neuromuscular re-education;Manual techniques;Passive range of motion;Patient/family education;Dry needling;Spinal Manipulations;Joint Manipulations             Patient will benefit from skilled therapeutic intervention in order to improve the following deficits and impairments:  Decreased activity tolerance, Decreased endurance, Decreased strength, Decreased range of motion, Hypomobility, Impaired UE functional use, Pain  Visit Diagnosis: Muscle weakness (generalized)  Chronic left shoulder pain  Chronic left-sided low back pain without sciatica  Limited range of motion (ROM) of shoulder     Problem List Patient Active Problem List   Diagnosis Date Noted   Shoulder pain, left 06/02/2021   Poison ivy dermatitis 04/01/2021   Family history of thyroid cancer 09/07/2020   Family history of prostate cancer in father 09/07/2020   Chronic midline low back pain without sciatica 07/07/2020   Acute knee pain 07/07/2020   Hyperlipidemia  09/05/2019   Other allergic rhinitis 06/03/2019   Coughing/wheezing 06/03/2019   Preventative health care 12/26/2018    Jahmeek Shirk, PT, DPT 08/10/2021, 11:33 AM  [place] [facility]  [address] [place], [place], [address] Phone: [phone]   Fax:  [phone]  Name: Jesus MaineStephen F Wiederholt MRN: [id number] Date of Birth: [birth year]

## 2021-08-11 ENCOUNTER — Ambulatory Visit: Payer: 59 | Admitting: Physical Therapy

## 2021-08-11 ENCOUNTER — Encounter: Payer: Self-pay | Admitting: Physical Therapy

## 2021-08-11 DIAGNOSIS — G8929 Other chronic pain: Secondary | ICD-10-CM | POA: Diagnosis not present

## 2021-08-11 DIAGNOSIS — M25619 Stiffness of unspecified shoulder, not elsewhere classified: Secondary | ICD-10-CM

## 2021-08-11 DIAGNOSIS — M25512 Pain in left shoulder: Secondary | ICD-10-CM

## 2021-08-11 DIAGNOSIS — M545 Low back pain, unspecified: Secondary | ICD-10-CM | POA: Diagnosis not present

## 2021-08-11 DIAGNOSIS — M6281 Muscle weakness (generalized): Secondary | ICD-10-CM | POA: Diagnosis not present

## 2021-08-11 NOTE — Therapy (Signed)
[place] MAIN [facility] SERVICES [address] [place], [place], [address] Phone: [phone]   Fax:  [phone]  Physical Therapy Treatment  Patient Details  Name: Jesus Carroll MRN: [id number] Date of Birth: August 03, 1980 Referring Provider (PT): Alma Friendly, NP   Encounter Date: 08/11/2021   PT End of Session - 08/11/21 1302     Visit Number 3    Number of Visits 25    Date for PT Re-Evaluation 10/19/21    Authorization Type Initial Certification [id number]- 10/19/2021    Progress Note Due on Visit 10    PT Start Time 1102    PT Stop Time 1150    PT Time Calculation (min) 48 min    Activity Tolerance Patient tolerated treatment well    Behavior During Therapy [facility] for tasks assessed/performed             Past Medical History:  Diagnosis Date   Allergic conjunctivitis 06/03/2019   Coughing/wheezing 06/03/2019    Past Surgical History:  Procedure Laterality Date   MICRODISCECTOMY LUMBAR  2006    There were no vitals filed for this visit.   Subjective Assessment - 08/11/21 1259     Subjective Pt reports improvement in pain following last session. He states decreased pain has continued today, reporting 3/10 upon arrival. States he is compliant with HEP and performs stretching with his son.    Pertinent History PER MD NOTE FROM 06/02/2021 submitted by Alma Friendly, NP- Jesus Carroll is a 41 year old male with a past medical history of chronic midline low back pain without sciatica presents for acute shoulder pain and continued lower back pain.   He was last seen for his back pain on 02/18/21 for an acute flare. Plain films of the lumbar spine were obtained which were unremarkable and he trialed cyclobenzaprine at night as well as Ibuprofen as needed to help with muscle relaxation.      Back pain: Pt had discectomy in 2006 and hs had chronic lower back pain since. In the past year, the pain is becoming more problematic. No  radiculopathy. He has been taking the cyclobenzaprine, and he reports as being helpful. Activities with bending and/or heavy lifting aggravate the pain. He also takes 800 mg of Ibuprofen approximately 3 times per week, which he also finds helpful. He also uses [employer] which is helpful.     Shoulder pain: Left shoulder pain onset one month ago at the proximal bicep muscle. He describes it as in intermittent, stabbing pain with activities that require external rotation. He rates the pain as 8/10. He did not have any precipitating traumatic event.    Limitations Sitting;Lifting;Standing;House hold activities;Walking    How long can you sit comfortably? 1 hour    How long can you stand comfortably? 1 hour    How long can you walk comfortably? 40 min    Diagnostic tests 02/2021-IMPRESSION:  No acute osseous abnormality.     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    Patient Stated Goals Full ROM for my shoulder as painfree as possible and improve my stiffness and pain in my back    Currently in Pain? Yes    Pain Score 3     Pain Location Back    Pain Orientation Left;Lower    Pain Descriptors / Indicators Aching;Sore    Pain Type Chronic pain    Pain Onset More than a month ago  TREATMENT:  Trigger Point Dry Needling (unbilled) Education performed with patient regarding potential benefit of TDN. Reviewed precautions and risks with patient. Pt provided verbal consent to treatment. TDN performed to with 0.30 x 60 single needle placements with local twitch response (LTR). Pistoning technique utilized. Improved pain-free motion following intervention. Muscles targeted: left lumbar paraspinals.    Patient prone: STM to L paraspinals and QL, 5 minutes.  PT performed grade II-III PA mobs to left SI joint 15 sec bouts x2 sets with moderate tenderness reported;   In prone, instructed patient in alternate hamstring curl AROM x10 reps each using 3# AW, no pain  reports Instructed patient in hip extension x10 reps each LE with decreased left side multifidi activation likely contributing to low back pain with decreased stabilization;    Patient transitioned to hooklying position:  Posterior pelvic tilt: 3 sec hold x15 reps with min VCS for proper positioning/exercise technique; Posterior pelvic tilt with alternate march x15 reps each LE with cues for increased core stabilization and breathing.    Instructed patient in LUE shoulder stabilization with 2# scapular protraction (punch) 2 x 10 reps; Static shoulder flexion at 90* with perturbations; 2 x 1 minute.  Seated row using RTB x10 reps; VC for correct technique.  PT performed grade II-III inferior joint mobs to LUE glenohumeral joint with good tolerance 15 sec bouts x3 sets;  Endorses tenderness to achieve position however denies during mobilization.     Supine stretching:  Hook lying lumbar rotation, 10 second hold x5 each direction, limited ROM to the right.  LLE single knee to chest, x45 seconds  LLE hamstring stretch, 2 x 45 seconds  LLE at 90-90 with crossover body for L lumbar/hip stretch, x45 seconds    Patient demonstrates excellent motivation during session. Dry needling performed to lumbar paraspinals this session with positive results. DN was followed up with STM, strengthening and stretching. Also continued left shoulder POC and initiated new stabilization exercises. Overall, pt responded well to session reporting improvement in pain and mobility. He would benefit from additional skilled PT Intervention to improve strength, ROM and reduce pain with ADLs.                 PT Short Term Goals - 07/28/21 1413       PT SHORT TERM GOAL #1   Title Pt will be independent with HEP in order to improve strength and decrease pain in order to improve pain-free function at home and work.    Baseline 07/27/2021- Patient presents with no formal HEP in place.    Time 6    Period Weeks     Status New    Target Date 09/07/21               PT Long Term Goals - 07/28/21 1424       PT LONG TERM GOAL #1   Title Pt will improve FOTO to target score of 64 to display perceived improvements in ability to complete ADL's.    Baseline 07/27/2021=53    Time 12    Period Weeks    Status New    Target Date 10/19/21      PT LONG TERM GOAL #2   Title Pt will decrease quick DASH score by at least 8% in order to demonstrate clinically significant reduction in disability.    Baseline 07/27/2021= 29.5 %    Time 12    Period Weeks    Status New    Target Date 10/19/21  PT LONG TERM GOAL #3   Title Pt will decrease worst pain as reported on NPRS by at least 3 points in order to demonstrate clinically significant reduction in low back and left shoudler pain.    Baseline 07/27/2021= 7/10 reported LPB and left shoulder pain    Time 12    Period Weeks    Status New    Target Date 10/19/21      PT LONG TERM GOAL #4   Title Pt will increase Left UE strength of  by at least 1/2 MMT grade in order to demonstrate improvement in strength and function    Baseline 07/27/2021= 2-/5 left shoulder flex/abd    Time 12    Period Weeks    Status New    Target Date 10/19/21                   Plan - 08/11/21 1302     Clinical Impression Statement Patient demonstrates excellent motivation during session. Dry needling performed to lumbar paraspinals this session with positive results. DN was followed up with STM, strengthening and stretching. Also continued left shoulder POC and initiated new stabilization exercises. Overall, pt responded well to session reporting improvement in pain and mobility. He would benefit from additional skilled PT Intervention to improve strength, ROM and reduce pain with ADLs.    Examination-Activity Limitations Bend;Carry;Lift;Stand;Squat;Reach Overhead    Examination-Participation Restrictions Cleaning;Community Activity;Yard Work    Copy Evolving/Moderate complexity    Rehab Potential Good    PT Frequency 2x / week    PT Duration 12 weeks    PT Treatment/Interventions ADLs/Self Care Home Management;Cryotherapy;Electrical Stimulation;Moist Heat;Traction;Stair training;Gait training;Functional mobility training;Therapeutic activities;Therapeutic exercise;Balance training;Neuromuscular re-education;Manual techniques;Passive range of motion;Patient/family education;Dry needling;Spinal Manipulations;Joint Manipulations             Patient will benefit from skilled therapeutic intervention in order to improve the following deficits and impairments:  Decreased activity tolerance, Decreased endurance, Decreased strength, Decreased range of motion, Hypomobility, Impaired UE functional use, Pain  Visit Diagnosis: Muscle weakness (generalized)  Chronic left shoulder pain  Chronic left-sided low back pain without sciatica  Limited range of motion (ROM) of shoulder     Problem List Patient Active Problem List   Diagnosis Date Noted   Shoulder pain, left 06/02/2021   Poison ivy dermatitis 04/01/2021   Family history of thyroid cancer 09/07/2020   Family history of prostate cancer in father 09/07/2020   Chronic midline low back pain without sciatica 07/07/2020   Acute knee pain 07/07/2020   Hyperlipidemia 09/05/2019   Other allergic rhinitis 06/03/2019   Coughing/wheezing 06/03/2019   Preventative health care 12/26/2018    Patrina Levering PT, DPT   [place] [facility] MAIN [facility] SERVICES [address] [place], [place], [address] Phone: [phone]   Fax:  [phone]  Name: Jesus Carroll MRN: [id number] Date of Birth: [birth year]

## 2021-08-13 ENCOUNTER — Ambulatory Visit: Payer: 59 | Admitting: Physical Therapy

## 2021-08-18 ENCOUNTER — Other Ambulatory Visit: Payer: Self-pay

## 2021-08-18 ENCOUNTER — Ambulatory Visit: Payer: 59

## 2021-08-18 DIAGNOSIS — G8929 Other chronic pain: Secondary | ICD-10-CM | POA: Diagnosis not present

## 2021-08-18 DIAGNOSIS — M6281 Muscle weakness (generalized): Secondary | ICD-10-CM | POA: Diagnosis not present

## 2021-08-18 DIAGNOSIS — M25619 Stiffness of unspecified shoulder, not elsewhere classified: Secondary | ICD-10-CM | POA: Diagnosis not present

## 2021-08-18 DIAGNOSIS — M545 Low back pain, unspecified: Secondary | ICD-10-CM | POA: Diagnosis not present

## 2021-08-18 DIAGNOSIS — M25512 Pain in left shoulder: Secondary | ICD-10-CM

## 2021-08-18 NOTE — Therapy (Signed)
[facility] MAIN [facility] SERVICES [address] [place], [place], [address] Phone: [phone]   Fax:  [phone]  Physical Therapy Treatment  Patient Details  Name: BALTASAR TWILLEY MRN: [id number] Date of Birth: March 09, 1981 Referring Provider (PT): Vernona Rieger, NP   Encounter Date: 08/18/2021   PT End of Session - 08/18/21 1009     Visit Number 4    Number of Visits 25    Date for PT Re-Evaluation 10/19/21    Authorization Type Initial Certification 07/27/2021- 10/19/2021    Progress Note Due on Visit 10    PT Start Time 1014    PT Stop Time 1058    PT Time Calculation (min) 44 min    Activity Tolerance Patient tolerated treatment well;No increased pain    Behavior During Therapy [facility] for tasks assessed/performed             Past Medical History:  Diagnosis Date   Allergic conjunctivitis 06/03/2019   Coughing/wheezing 06/03/2019    Past Surgical History:  Procedure Laterality Date   MICRODISCECTOMY LUMBAR  2006    There were no vitals filed for this visit.   Subjective Assessment - 08/18/21 1008     Subjective Patient reports his shoulder has been better since his first visit. He reports still restricted with lifting but no pain. States he has "Hit or miss" days with his low back and complaining of increased low back today due to prolonged standing with work yesterday. He did endorse some pain relief with dry needling.    Pertinent History PER MD NOTE FROM 06/02/2021 submitted by Vernona Rieger, NP- CONRAD ZAJKOWSKI is a 41 year old male with a past medical history of chronic midline low back pain without sciatica presents for acute shoulder pain and continued lower back pain.   He was last seen for his back pain on 02/18/21 for an acute flare. Plain films of the lumbar spine were obtained which were unremarkable and he trialed cyclobenzaprine at night as well as Ibuprofen as needed to help with muscle relaxation.      Back pain: Pt  had discectomy in 2006 and hs had chronic lower back pain since. In the past year, the pain is becoming more problematic. No radiculopathy. He has been taking the cyclobenzaprine, and he reports as being helpful. Activities with bending and/or heavy lifting aggravate the pain. He also takes 800 mg of Ibuprofen approximately 3 times per week, which he also finds helpful. He also uses [employer] which is helpful.     Shoulder pain: Left shoulder pain onset one month ago at the proximal bicep muscle. He describes it as in intermittent, stabbing pain with activities that require external rotation. He rates the pain as 8/10. He did not have any precipitating traumatic event.    Limitations Sitting;Lifting;Standing;House hold activities;Walking    How long can you sit comfortably? 1 hour    How long can you stand comfortably? 1 hour    How long can you walk comfortably? 40 min    Diagnostic tests 02/2021-IMPRESSION:  No acute osseous abnormality.     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    Patient Stated Goals Full ROM for my shoulder as painfree as possible and improve my stiffness and pain in my back    Currently in Pain? Yes    Pain Score 3     Pain Location Back    Pain Orientation Left;Lower    Pain Descriptors /  Indicators Aching;Sore    Pain Type Chronic pain    Pain Onset More than a month ago    Pain Frequency Intermittent    Aggravating Factors  Lifting; prolonged standing    Pain Relieving Factors Meds/rest              INTERVENTIONS:  Manual Therapy:   Supine manual stretching:  - lumbar rotation, 20 second hold x4 each direction  -single knee to chest, x30 sseconds x4  each Leg  -hamstring stretch, 2 x 45 seconds  - Figure 4 x 30 sec x 4 each LE   Patient prone: STM to L paraspinals  5 minutes.  PT performed grade II-III PA mobs to L2- 5 x 30 bouts and  left SI joint 30 bouts with increased hypomobility noted at L2-L3. *palpated Left lower  paraspinals - one noted nodule( area of tightness) - does not feel like a trigger point- Informed patient and instructed him to contact his PCP for possible further testing.    Therex:   Prone hip ext- Left LE x 10 and attempted on right but only able to perform 3 reps prior to onset of increased LBP -ceased exercise.    Patient transitioned to hooklying position:  Instructed in transverse abdominal contraction- Feet for ASIS and move fingers just medially - brace abs and feel for muscle tension and once patient able to perform correctly- Instructed to perform bent knee hip raises (from hooklye position) while bracing abs.  X 10 Reps each LE.  *Utilized a towel thinly rolled under under lumbar curvature and explained if PT could pull towel out that he was able to maintain curvature while bracing abs. He performed well with good activation of TA today.   Instructed in Log roll technique - maintaining TA contracture - patient able to roll correct and perform supine- sidelye- to sit well without pain.    Education provided throughout session via VC/TC and demonstration to facilitate movement at target joints and correct muscle activation for all testing and exercises performed.                                     PT Education - 08/18/21 1141     Education Details Exercise technique- Transverse abdominals    Person(s) Educated Patient    Methods Explanation;Demonstration;Tactile cues;Verbal cues    Comprehension Verbalized understanding;Returned demonstration;Verbal cues required;Tactile cues required;Need further instruction              PT Short Term Goals - 07/28/21 1413       PT SHORT TERM GOAL #1   Title Pt will be independent with HEP in order to improve strength and decrease pain in order to improve pain-free function at home and work.    Baseline 07/27/2021- Patient presents with no formal HEP in place.    Time 6    Period Weeks    Status New     Target Date 09/07/21               PT Long Term Goals - 07/28/21 1424       PT LONG TERM GOAL #1   Title Pt will improve FOTO to target score of 64 to display perceived improvements in ability to complete ADL's.    Baseline 07/27/2021=53    Time 12    Period Weeks    Status New    Target Date 10/19/21  PT LONG TERM GOAL #2   Title Pt will decrease quick DASH score by at least 8% in order to demonstrate clinically significant reduction in disability.    Baseline 07/27/2021= 29.5 %    Time 12    Period Weeks    Status New    Target Date 10/19/21      PT LONG TERM GOAL #3   Title Pt will decrease worst pain as reported on NPRS by at least 3 points in order to demonstrate clinically significant reduction in low back and left shoudler pain.    Baseline 07/27/2021= 7/10 reported LPB and left shoulder pain    Time 12    Period Weeks    Status New    Target Date 10/19/21      PT LONG TERM GOAL #4   Title Pt will increase Left UE strength of  by at least 1/2 MMT grade in order to demonstrate improvement in strength and function    Baseline 07/27/2021= 2-/5 left shoulder flex/abd    Time 12    Period Weeks    Status New    Target Date 10/19/21                   Plan - 08/18/21 1009     Clinical Impression Statement Patient demonstrates excellent motivation during session. Patient presents with increased low back initially but responded well to manual technique with improved flexibility and no worsening of pain. Discussed need to contact PCP to have further evaluation of nodule/tightness noted in left side Low back. Patient will benefit from additional skilled PT Intervention to improve strength, ROM and reduce pain with ADLs.    Examination-Activity Limitations Bend;Carry;Lift;Stand;Squat;Reach Overhead    Examination-Participation Restrictions Cleaning;Community Activity;Yard Work    Conservation officer, historic buildings Evolving/Moderate complexity    Rehab Potential  Good    PT Frequency 2x / week    PT Duration 12 weeks    PT Treatment/Interventions ADLs/Self Care Home Management;Cryotherapy;Electrical Stimulation;Moist Heat;Traction;Stair training;Gait training;Functional mobility training;Therapeutic activities;Therapeutic exercise;Balance training;Neuromuscular re-education;Manual techniques;Passive range of motion;Patient/family education;Dry needling;Spinal Manipulations;Joint Manipulations    PT Next Visit Plan Continue with manual techniques to assist with low back pain/ROM and progress left shoulder strengthening as appropriate.    PT Home Exercise Plan Added TA bracing for HEP             Patient will benefit from skilled therapeutic intervention in order to improve the following deficits and impairments:  Decreased activity tolerance, Decreased endurance, Decreased strength, Decreased range of motion, Hypomobility, Impaired UE functional use, Pain  Visit Diagnosis: Muscle weakness (generalized)  Chronic left shoulder pain  Chronic bilateral low back pain without sciatica  Limited range of motion (ROM) of shoulder     Problem List Patient Active Problem List   Diagnosis Date Noted   Shoulder pain, left 06/02/2021   Poison ivy dermatitis 04/01/2021   Family history of thyroid cancer 09/07/2020   Family history of prostate cancer in father 09/07/2020   Chronic midline low back pain without sciatica 07/07/2020   Acute knee pain 07/07/2020   Hyperlipidemia 09/05/2019   Other allergic rhinitis 06/03/2019   Coughing/wheezing 06/03/2019   Preventative health care 12/26/2018    Lenda Kelp, PT 08/18/2021, 11:46 AM  [facility] MAIN [facility] SERVICES [address] [place], [place], [address] Phone: [phone]   Fax:  [phone]  Name: DESHAY BLUMENFELD MRN: [id number] Date of Birth: November 12, 1980

## 2021-08-20 ENCOUNTER — Other Ambulatory Visit: Payer: Self-pay

## 2021-08-20 ENCOUNTER — Ambulatory Visit: Payer: 59 | Admitting: Physical Therapy

## 2021-08-20 DIAGNOSIS — G8929 Other chronic pain: Secondary | ICD-10-CM

## 2021-08-20 DIAGNOSIS — M545 Low back pain, unspecified: Secondary | ICD-10-CM | POA: Diagnosis not present

## 2021-08-20 DIAGNOSIS — M25619 Stiffness of unspecified shoulder, not elsewhere classified: Secondary | ICD-10-CM

## 2021-08-20 DIAGNOSIS — M6281 Muscle weakness (generalized): Secondary | ICD-10-CM | POA: Diagnosis not present

## 2021-08-20 DIAGNOSIS — M25512 Pain in left shoulder: Secondary | ICD-10-CM | POA: Diagnosis not present

## 2021-08-20 NOTE — Therapy (Signed)
[facility]IN [facility] [address] [place], [place], [address] Phone: [phone]   Fax:  [phone]  Physical Therapy Treatment  Patient Details  Name: Jesus MaineStephen F Duffell MRN: [id number] Date of Birth: 03/11/1981 Referring Provider (PT): Vernona RiegerKatherine Clark, NP   Encounter Date: 08/20/2021   PT End of Session - 08/20/21 1158     Visit Number 5    Number of Visits 25    Date for PT Re-Evaluation 10/19/21    Authorization Type Initial Certification 07/27/2021- 10/19/2021    Progress Note Due on Visit 10    PT Start Time 0917    PT Stop Time 0957    PT Time Calculation (min) 40 min    Activity Tolerance Patient tolerated treatment well;No increased pain    Behavior During Therapy [facility] for tasks assessed/performed             Past Medical History:  Diagnosis Date   Allergic conjunctivitis 06/03/2019   Coughing/wheezing 06/03/2019    Past Surgical History:  Procedure Laterality Date   MICRODISCECTOMY LUMBAR  2006    There were no vitals filed for this visit.   Subjective Assessment - 08/20/21 0926     Subjective Patient reports his shoulder has been better since his first visit. Pt reports lifting his arm has improved but still unable to lift with weight in his arms. Pt repots being able to perform push ups this past week.    Pertinent History PER MD NOTE FROM 06/02/2021 submitted by Vernona RiegerKatherine Clark, NP- Jesus MaineStephen F Sheek is a 41 year old male with a past medical history of chronic midline low back pain without sciatica presents for acute shoulder pain and continued lower back pain.   He was last seen for his back pain on 02/18/21 for an acute flare. Plain films of the lumbar spine were obtained which were unremarkable and he trialed cyclobenzaprine at night as well as Ibuprofen as needed to help with muscle relaxation.      Back pain: Pt had discectomy in 2006 and hs had chronic lower back pain since. In the past year, the pain is  becoming more problematic. No radiculopathy. He has been taking the cyclobenzaprine, and he reports as being helpful. Activities with bending and/or heavy lifting aggravate the pain. He also takes 800 mg of Ibuprofen approximately 3 times per week, which he also finds helpful. He also uses [employer] which is helpful.     Shoulder pain: Left shoulder pain onset one month ago at the proximal bicep muscle. He describes it as in intermittent, stabbing pain with activities that require external rotation. He rates the pain as 8/10. He did not have any precipitating traumatic event.    Limitations Sitting;Lifting;Standing;House hold activities;Walking    How long can you sit comfortably? 1 hour    How long can you stand comfortably? 1 hour    How long can you walk comfortably? 40 min    Diagnostic tests 02/2021-IMPRESSION:  No acute osseous abnormality.     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    Patient Stated Goals Full ROM for my shoulder as painfree as possible and improve my stiffness and pain in my back    Pain Onset More than a month ago               INTERVENTIONS:  Manual Therapy:   Supine manual stretching:  - lumbar rotation, 20 second hold x4 each direction  -single knee  to chest, x30 sseconds x4  each Leg - Figure 4 x 30 sec x 4 each LE  Supine bridge with core muscle activation and rhythmic perturbations performed by PT for lumbar stabilization exercise X 10   Patient prone: STM to L paraspinals  5 minutes.  -Tight areas located on base of paraspinals bilaterally.  Patient reports he is areas have improved but are still present.   Therex:   Prone shoulder flexion: 2 x 10 2#  Prone shoulder abduction 2 x 10 2# Supine shoulder serratus punch 2#, 2 x 10  -added shoulder extension and flexion to 90 degrees fo rfunctional strengthening of serratus x 10  - shoulder upward rotation force souple strengthening and training Scapular assistance test  positive indicating some scapular dyskinesia.  -Pt noted improved shoulder pain with endrange shoulder abduction.  Education provided throughout session via VC/TC and demonstration to facilitate movement at target joints and correct muscle activation for all testing and exercises performed.                                        PT Education - 08/20/21 1158     Education Details Instructed in shoulder strengthening exercises to improve improved rotation    Person(s) Educated Patient    Methods Explanation;Demonstration    Comprehension Verbalized understanding;Returned demonstration              PT Short Term Goals - 07/28/21 1413       PT SHORT TERM GOAL #1   Title Pt will be independent with HEP in order to improve strength and decrease pain in order to improve pain-free function at home and work.    Baseline 07/27/2021- Patient presents with no formal HEP in place.    Time 6    Period Weeks    Status New    Target Date 09/07/21               PT Long Term Goals - 07/28/21 1424       PT LONG TERM GOAL #1   Title Pt will improve FOTO to target score of 64 to display perceived improvements in ability to complete ADL's.    Baseline 07/27/2021=53    Time 12    Period Weeks    Status New    Target Date 10/19/21      PT LONG TERM GOAL #2   Title Pt will decrease quick DASH score by at least 8% in order to demonstrate clinically significant reduction in disability.    Baseline 07/27/2021= 29.5 %    Time 12    Period Weeks    Status New    Target Date 10/19/21      PT LONG TERM GOAL #3   Title Pt will decrease worst pain as reported on NPRS by at least 3 points in order to demonstrate clinically significant reduction in low back and left shoudler pain.    Baseline 07/27/2021= 7/10 reported LPB and left shoulder pain    Time 12    Period Weeks    Status New    Target Date 10/19/21      PT LONG TERM GOAL #4   Title Pt will increase Left UE  strength of  by at least 1/2 MMT grade in order to demonstrate improvement in strength and function    Baseline 07/27/2021= 2-/5 left shoulder flex/abd    Time 12  Period Weeks    Status New    Target Date 10/19/21                   Plan - 08/20/21 1159     Clinical Impression Statement Patient demonstrates excellent motivation during session.  Patient presents with improved pain in both his low back and his left shoulder.  Patient instructed in exercises to improve upward rotation force couple strengthening on the left and following therapeutic interventions and activation of these muscles patient reported improved left shoulder pain at end range abduction.  Continue with low back gentle stabilization training as well as low back stretching the session.  Patient will continue to benefit from skilled physical therapy intervention in order to improve strength, range of motion, and improve his ability to perform ADLs without increased levels of pain.    Examination-Activity Limitations Bend;Carry;Lift;Stand;Squat;Reach Overhead    Examination-Participation Restrictions Cleaning;Community Activity;Yard Work    Conservation officer, historic buildings Evolving/Moderate complexity    Rehab Potential Good    PT Frequency 2x / week    PT Duration 12 weeks    PT Treatment/Interventions ADLs/Self Care Home Management;Cryotherapy;Electrical Stimulation;Moist Heat;Traction;Stair training;Gait training;Functional mobility training;Therapeutic activities;Therapeutic exercise;Balance training;Neuromuscular re-education;Manual techniques;Passive range of motion;Patient/family education;Dry needling;Spinal Manipulations;Joint Manipulations    PT Next Visit Plan Continue with manual techniques to assist with low back pain/ROM and progress left shoulder strengthening as appropriate.    PT Home Exercise Plan Added prone shoulder flexion prone horizontal abduction and serratus anterior punches followed by  extension and flexion of the shoulder.    Consulted and Agree with Plan of Care Patient             Patient will benefit from skilled therapeutic intervention in order to improve the following deficits and impairments:  Decreased activity tolerance, Decreased endurance, Decreased strength, Decreased range of motion, Hypomobility, Impaired UE functional use, Pain  Visit Diagnosis: Muscle weakness (generalized)  Chronic left shoulder pain  Chronic bilateral low back pain without sciatica  Limited range of motion (ROM) of shoulder     Problem List Patient Active Problem List   Diagnosis Date Noted   Shoulder pain, left 06/02/2021   Poison ivy dermatitis 04/01/2021   Family history of thyroid cancer 09/07/2020   Family history of prostate cancer in father 09/07/2020   Chronic midline low back pain without sciatica 07/07/2020   Acute knee pain 07/07/2020   Hyperlipidemia 09/05/2019   Other allergic rhinitis 06/03/2019   Coughing/wheezing 06/03/2019   Preventative health care 12/26/2018    Norman Herrlich, PT 08/20/2021, 12:04 PM  [facility] MAIN [facility] SERVICES [address] [place], [place], [address] Phone: [phone]   Fax:  [phone]  Name: MADDAX PALINKAS MRN: [id number] Date of Birth: November 17, 1980

## 2021-08-23 ENCOUNTER — Ambulatory Visit: Payer: 59

## 2021-08-25 ENCOUNTER — Other Ambulatory Visit: Payer: Self-pay

## 2021-08-25 ENCOUNTER — Ambulatory Visit: Payer: 59 | Attending: Primary Care

## 2021-08-25 DIAGNOSIS — M25619 Stiffness of unspecified shoulder, not elsewhere classified: Secondary | ICD-10-CM | POA: Diagnosis not present

## 2021-08-25 DIAGNOSIS — M6281 Muscle weakness (generalized): Secondary | ICD-10-CM | POA: Diagnosis not present

## 2021-08-25 DIAGNOSIS — G8929 Other chronic pain: Secondary | ICD-10-CM | POA: Insufficient documentation

## 2021-08-25 DIAGNOSIS — M545 Low back pain, unspecified: Secondary | ICD-10-CM | POA: Insufficient documentation

## 2021-08-25 DIAGNOSIS — M25512 Pain in left shoulder: Secondary | ICD-10-CM | POA: Diagnosis not present

## 2021-08-25 NOTE — Therapy (Signed)
[place] MAIN [facility] SERVICES [address] [place], [place], [address] Phone: [phone]   Fax:  [phone]  Physical Therapy Treatment  Patient Details  Name: Jesus Carroll MRN: [id number] Date of Birth: 10/01/1980 Referring Provider (PT): Alma Friendly, NP   Encounter Date: 08/25/2021   PT End of Session - 08/25/21 1341     Visit Number 6    Number of Visits 25    Date for PT Re-Evaluation 10/19/21    Authorization Type Initial Certification [id number]- 10/19/2021    Progress Note Due on Visit 10    PT Start Time 0938    PT Stop Time 1015    PT Time Calculation (min) 37 min    Activity Tolerance Patient tolerated treatment well;No increased pain    Behavior During Therapy [facility] for tasks assessed/performed             Past Medical History:  Diagnosis Date   Allergic conjunctivitis 06/03/2019   Coughing/wheezing 06/03/2019    Past Surgical History:  Procedure Laterality Date   MICRODISCECTOMY LUMBAR  2006    There were no vitals filed for this visit.   Subjective Assessment - 08/25/21 0939     Subjective Patient reports doing much better overall- Denies any pain and reports only soreness in left shoulder after last session.    Pertinent History PER MD NOTE FROM 06/02/2021 submitted by Alma Friendly, NP- Jesus Carroll is a 41 year old male with a past medical history of chronic midline low back pain without sciatica presents for acute shoulder pain and continued lower back pain.   He was last seen for his back pain on 02/18/21 for an acute flare. Plain films of the lumbar spine were obtained which were unremarkable and he trialed cyclobenzaprine at night as well as Ibuprofen as needed to help with muscle relaxation.      Back pain: Pt had discectomy in 2006 and hs had chronic lower back pain since. In the past year, the pain is becoming more problematic. No radiculopathy. He has been taking the cyclobenzaprine, and he reports  as being helpful. Activities with bending and/or heavy lifting aggravate the pain. He also takes 800 mg of Ibuprofen approximately 3 times per week, which he also finds helpful. He also uses [employer] which is helpful.     Shoulder pain: Left shoulder pain onset one month ago at the proximal bicep muscle. He describes it as in intermittent, stabbing pain with activities that require external rotation. He rates the pain as 8/10. He did not have any precipitating traumatic event.    Limitations Sitting;Lifting;Standing;House hold activities;Walking    How long can you sit comfortably? 1 hour    How long can you stand comfortably? 1 hour    How long can you walk comfortably? 40 min    Diagnostic tests 02/2021-IMPRESSION:  No acute osseous abnormality.     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    Patient Stated Goals Full ROM for my shoulder as painfree as possible and improve my stiffness and pain in my back    Currently in Pain? No/denies    Pain Onset More than a month ago           INTERVENTIONS:    Manual Therapy:    Supine manual stretching:    -single knee to chest, x 30 seconds x4  each Leg  -hamstring stretch, 2 x 30 seconds  - Figure 4 x 30  sec x 4 each LE - Hip circles- 10 reps clockwise/10 reps counterclockwise *Right LE presents tighter than left but no reported increase in pain.   Therapeutic Exercises:   Core stabilization with theraball (sm. Green)   Knee to chest- Heels on ball and VC to tighten transverse abd x 12 reps.  Bridging on ball x 6 reps (some difficulty and increased low back tightness with technique) - removed ball and patient able to perform 12 reps without difficulty.  Lower trunk rotation- LE on theraball x 12 reps each Quadraped- alt UE raise with TA contraction x 12 each arm Quadraped- alt LE raise with TA contraction x 12 each LE- VC to keep back straight and use of mirror for feedback.  Quadraped into prayer stretch x 20 sec  hold x 2     -supine rhythmic stabilization left shoulder (at 90 deg of sh. Flex) x 97min- Mild fatigue yet no complaint of pain -Serratus punches- Protraction of scapula- at 90 deg x 12 reps on left UE.  -Seated Shoulder ER using GTB x 10-12 reps Standing Shoulder ABD using GTB x 10 reps Education provided throughout session via VC/TC and demonstration to facilitate movement at target joints and correct muscle activation for all testing and exercises performed.   Added Shoulder ER and ABD to HEP today:   Access Code: [email or web address] URL: [email or web address] Date: 08/25/2021 Prepared by: Sande Brothers  Exercises Standing Single Arm Shoulder Abduction with Resistance - 1 x daily - 3 x weekly - 3 sets - 10 reps - 2 hold Shoulder External Rotation and Scapular Retraction with Resistance - 1 x daily - 3 x weekly - 3 sets - 10 reps                         PT Education - 08/25/21 1341     Education Details Core exercise/Shoulder strengthening exercises    Person(s) Educated Patient    Methods Explanation;Demonstration;Tactile cues;Verbal cues;Handout    Comprehension Verbalized understanding;Returned demonstration;Verbal cues required;Tactile cues required;Need further instruction              PT Short Term Goals - 07/28/21 1413       PT SHORT TERM GOAL #1   Title Pt will be independent with HEP in order to improve strength and decrease pain in order to improve pain-free function at home and work.    Baseline 07/27/2021- Patient presents with no formal HEP in place.    Time 6    Period Weeks    Status New    Target Date 09/07/21               PT Long Term Goals - 07/28/21 1424       PT LONG TERM GOAL #1   Title Pt will improve FOTO to target score of 64 to display perceived improvements in ability to complete ADL's.    Baseline 07/27/2021=53    Time 12    Period Weeks    Status New    Target Date 10/19/21      PT LONG TERM GOAL  #2   Title Pt will decrease quick DASH score by at least 8% in order to demonstrate clinically significant reduction in disability.    Baseline 07/27/2021= 29.5 %    Time 12    Period Weeks    Status New    Target Date 10/19/21      PT LONG TERM GOAL #3   Title Pt will  decrease worst pain as reported on NPRS by at least 3 points in order to demonstrate clinically significant reduction in low back and left shoudler pain.    Baseline 07/27/2021= 7/10 reported LPB and left shoulder pain    Time 12    Period Weeks    Status New    Target Date 10/19/21      PT LONG TERM GOAL #4   Title Pt will increase Left UE strength of  by at least 1/2 MMT grade in order to demonstrate improvement in strength and function    Baseline 07/27/2021= 2-/5 left shoulder flex/abd    Time 12    Period Weeks    Status New    Target Date 10/19/21                   Plan - 08/25/21 1342     Clinical Impression Statement Patient presented to clinic today well motivated and pain well controlled today. He was able to progress to core stabilization using a theraball without increased pain and able to progress to resistive training with Left shoulder without report of pain or significant difficulty. Patient will benefit from additional skilled PT Intervention to improve strength, ROM and reduce pain with ADLs.    Examination-Activity Limitations Bend;Carry;Lift;Stand;Squat;Reach Overhead    Examination-Participation Restrictions Cleaning;Community Activity;Yard Work    [employer] Evolving/Moderate complexity    Rehab Potential Good    PT Frequency 2x / week    PT Duration 12 weeks    PT Treatment/Interventions ADLs/Self Care Home Management;Cryotherapy;Electrical Stimulation;Moist Heat;Traction;Stair training;Gait training;Functional mobility training;Therapeutic activities;Therapeutic exercise;Balance training;Neuromuscular re-education;Manual techniques;Passive range of  motion;Patient/family education;Dry needling;Spinal Manipulations;Joint Manipulations    PT Next Visit Plan Continue with manual techniques to assist with low back pain/ROM and progress left shoulder strengthening as appropriate.    PT Home Exercise Plan Added prone shoulder flexion prone horizontal abduction and serratus anterior punches followed by extension and flexion of the shoulder.  08/25/2021=Access Code: [email or web address]  - Shoulder ER and ABD (resistive with TB)    Consulted and Agree with Plan of Care Patient             Patient will benefit from skilled therapeutic intervention in order to improve the following deficits and impairments:  Decreased activity tolerance, Decreased endurance, Decreased strength, Decreased range of motion, Hypomobility, Impaired UE functional use, Pain  Visit Diagnosis: Chronic bilateral low back pain without sciatica  Muscle weakness (generalized)  Chronic left shoulder pain  Limited range of motion of shoulder     Problem List Patient Active Problem List   Diagnosis Date Noted   Shoulder pain, left 06/02/2021   Poison ivy dermatitis 04/01/2021   Family history of thyroid cancer 09/07/2020   Family history of prostate cancer in father 09/07/2020   Chronic midline low back pain without sciatica 07/07/2020   Acute knee pain 07/07/2020   Hyperlipidemia 09/05/2019   Other allergic rhinitis 06/03/2019   Coughing/wheezing 06/03/2019   Preventative health care 12/26/2018    Lewis Moccasin, PT 08/25/2021, 4:28 PM  [place] MAIN [facility] SERVICES [address] [place], [place], [address] Phone: [phone]   Fax:  [phone]  Name: Jesus Carroll MRN: [id number] Date of Birth: Apr 16, 1981

## 2021-08-30 ENCOUNTER — Ambulatory Visit: Payer: 59

## 2021-08-31 ENCOUNTER — Telehealth: Payer: 59 | Admitting: Physician Assistant

## 2021-08-31 ENCOUNTER — Other Ambulatory Visit: Payer: Self-pay

## 2021-08-31 DIAGNOSIS — B9689 Other specified bacterial agents as the cause of diseases classified elsewhere: Secondary | ICD-10-CM | POA: Diagnosis not present

## 2021-08-31 DIAGNOSIS — J208 Acute bronchitis due to other specified organisms: Secondary | ICD-10-CM | POA: Diagnosis not present

## 2021-08-31 MED ORDER — BENZONATATE 100 MG PO CAPS
100.0000 mg | ORAL_CAPSULE | Freq: Three times a day (TID) | ORAL | 0 refills | Status: DC | PRN
  Filled 2021-08-31: qty 30, 10d supply, fill #0

## 2021-08-31 MED ORDER — ALBUTEROL SULFATE HFA 108 (90 BASE) MCG/ACT IN AERS
2.0000 | INHALATION_SPRAY | Freq: Four times a day (QID) | RESPIRATORY_TRACT | 0 refills | Status: AC | PRN
  Filled 2021-08-31: qty 8.5, 25d supply, fill #0

## 2021-08-31 MED ORDER — DOXYCYCLINE HYCLATE 100 MG PO TABS
100.0000 mg | ORAL_TABLET | Freq: Two times a day (BID) | ORAL | 0 refills | Status: DC
  Filled 2021-08-31: qty 14, 7d supply, fill #0

## 2021-08-31 NOTE — Progress Notes (Signed)
Virtual Visit Consent   DAREAN LIMAS, you are scheduled for a virtual visit with a [facility] provider today.     Just as with appointments in the office, your consent must be obtained to participate.  Your consent will be active for this visit and any virtual visit you may have with one of our providers in the next 365 days.     If you have a MyChart account, a copy of this consent can be sent to you electronically.  All virtual visits are billed to your insurance company just like a traditional visit in the office.    As this is a virtual visit, video technology does not allow for your provider to perform a traditional examination.  This may limit your provider's ability to fully assess your condition.  If your provider identifies any concerns that need to be evaluated in person or the need to arrange testing (such as labs, EKG, etc.), we will make arrangements to do so.     Although advances in technology are sophisticated, we cannot ensure that it will always work on either your end or our end.  If the connection with a video visit is poor, the visit may have to be switched to a telephone visit.  With either a video or telephone visit, we are not always able to ensure that we have a secure connection.     I need to obtain your verbal consent now.   Are you willing to proceed with your visit today?    Jesus Carroll has provided verbal consent on 08/31/2021 for a virtual visit (video or telephone).   Leeanne Rio, [place]   Date: 08/31/2021 4:17 PM   Virtual Visit via Video Note   I, Leeanne Rio, connected with  Jesus Carroll  ([id number], 10/19/1980) on 08/31/21 at  4:15 PM EST by a video-enabled telemedicine application and verified that I am speaking with the correct person using two identifiers.  Location: Patient: Virtual Visit Location Patient: Home Provider: Virtual Visit Location Provider: Home Office   I discussed the limitations of evaluation and management  by telemedicine and the availability of in person appointments. The patient expressed understanding and agreed to proceed.    History of Present Illness: Jesus Carroll is a 41 y.o. who identifies as a male who was assigned male at birth, and is being seen today for ongoing URI symptoms. Notes over the past month having waxing   Fever for the past 2-3 days.  Has been sick off an on since the middle of December after his son got sick. Is bringing home stuff from school and going around to the rest of the family. Notes significant cough and congestion, headache with sinus pressure and sinus pain. Fever 102-104. 103 last check. Has not taken Ibuprofen since this morning. Some chest tightness and wheezing on occasion. Thick green nasal drainage with odor.   HPI: HPI  Problems:  Patient Active Problem List   Diagnosis Date Noted   Shoulder pain, left 06/02/2021   Poison ivy dermatitis 04/01/2021   Family history of thyroid cancer 09/07/2020   Family history of prostate cancer in father 09/07/2020   Chronic midline low back pain without sciatica 07/07/2020   Acute knee pain 07/07/2020   Hyperlipidemia 09/05/2019   Other allergic rhinitis 06/03/2019   Coughing/wheezing 06/03/2019   Preventative health care 12/26/2018    Allergies: No Known Allergies Medications:  Current Outpatient Medications:    albuterol (VENTOLIN HFA) 108 (  90 Base) MCG/ACT inhaler, Inhale 2 puffs into the lungs every 6 (six) hours as needed for wheezing or shortness of breath., Disp: 8.5 g, Rfl: 0   benzonatate (TESSALON) 100 MG capsule, Take 1 capsule (100 mg total) by mouth 3 (three) times daily as needed for cough., Disp: 30 capsule, Rfl: 0   doxycycline (VIBRA-TABS) 100 MG tablet, Take 1 tablet (100 mg total) by mouth 2 (two) times daily., Disp: 14 tablet, Rfl: 0  Observations/Objective: Patient is well-developed, well-nourished in no acute distress.  Resting comfortably on couch at home.  Head is normocephalic,  atraumatic.  No labored breathing. Speech is clear and coherent with logical content.  Patient is alert and oriented at baseline.   Assessment and Plan: 1. Acute bacterial bronchitis - benzonatate (TESSALON) 100 MG capsule; Take 1 capsule (100 mg total) by mouth 3 (three) times daily as needed for cough.  Dispense: 30 capsule; Refill: 0 - albuterol (VENTOLIN HFA) 108 (90 Base) MCG/ACT inhaler; Inhale 2 puffs into the lungs every 6 (six) hours as needed for wheezing or shortness of breath.  Dispense: 8.5 g; Refill: 0 - doxycycline (VIBRA-TABS) 100 MG tablet; Take 1 tablet (100 mg total) by mouth 2 (two) times daily.  Dispense: 14 tablet; Refill: 0  Possible walking pneumonia giving fever. Wills tart Doxycyline to cover for bronchitis/pneumonia and with sinus coverage as it seems he has had several things festering for some time. Supportive measures and OTC medications reviewed. Tessalon and Albuterol per orders. Discussed need for PCP follow-up. Strict ER precautions reviewed with patient.   Follow Up Instructions: I discussed the assessment and treatment plan with the patient. The patient was provided an opportunity to ask questions and all were answered. The patient agreed with the plan and demonstrated an understanding of the instructions.  A copy of instructions were sent to the patient via MyChart unless otherwise noted below.   The patient was advised to call back or seek an in-person evaluation if the symptoms worsen or if the condition fails to improve as anticipated.  Time:  I spent 10 minutes with the patient via telehealth technology discussing the above problems/concerns.    Leeanne Rio, PA-C

## 2021-08-31 NOTE — Patient Instructions (Signed)
Lucila [place], thank you for joining Piedad Climes, PA-C for today's virtual visit.  While this provider is not your primary care provider (PCP), if your PCP is located in our provider database this encounter information will be shared with them immediately following your visit.  Consent: (Patient) Jesus Carroll provided verbal consent for this virtual visit at the beginning of the encounter.  Current Medications:  Current Outpatient Medications:    albuterol (VENTOLIN HFA) 108 (90 Base) MCG/ACT inhaler, Inhale 2 puffs into the lungs every 6 (six) hours as needed for wheezing or shortness of breath., Disp: 8 g, Rfl: 0   benzonatate (TESSALON) 100 MG capsule, Take 1 capsule (100 mg total) by mouth 3 (three) times daily as needed for cough., Disp: 30 capsule, Rfl: 0   doxycycline (VIBRA-TABS) 100 MG tablet, Take 1 tablet (100 mg total) by mouth 2 (two) times daily., Disp: 14 tablet, Rfl: 0   Medications ordered in this encounter:  Meds ordered this encounter  Medications   benzonatate (TESSALON) 100 MG capsule    Sig: Take 1 capsule (100 mg total) by mouth 3 (three) times daily as needed for cough.    Dispense:  30 capsule    Refill:  0    Order Specific Question:   Supervising Provider    Answer:   MILLER, BRIAN [[id number]]   albuterol (VENTOLIN HFA) 108 (90 Base) MCG/ACT inhaler    Sig: Inhale 2 puffs into the lungs every 6 (six) hours as needed for wheezing or shortness of breath.    Dispense:  8 g    Refill:  0    Order Specific Question:   Supervising Provider    Answer:   MILLER, BRIAN [[id number]]   doxycycline (VIBRA-TABS) 100 MG tablet    Sig: Take 1 tablet (100 mg total) by mouth 2 (two) times daily.    Dispense:  14 tablet    Refill:  0    Order Specific Question:   Supervising Provider    Answer:   Hyacinth Meeker, BRIAN [[id number]]     *If you need refills on other medications prior to your next appointment, please contact your pharmacy*  Follow-Up: Call back or seek an  in-person evaluation if the symptoms worsen or if the condition fails to improve as anticipated.  Other Instructions Take antibiotic (Doxycycline) as directed.  Increase fluids.  Get plenty of rest. Use Mucinex for congestion. Tessalon for cough. Take a daily probiotic (I recommend Align or Culturelle, but even Activia Yogurt may be beneficial).  A humidifier placed in the bedroom may offer some relief for a dry, scratchy throat of nasal irritation.  Read information below on acute bronchitis. Please call or return to clinic if symptoms are not improving.  Acute Bronchitis Bronchitis is when the airways that extend from the windpipe into the lungs get red, puffy, and painful (inflamed). Bronchitis often causes thick spit (mucus) to develop. This leads to a cough. A cough is the most common symptom of bronchitis. In acute bronchitis, the condition usually begins suddenly and goes away over time (usually in 2 weeks). Smoking, allergies, and asthma can make bronchitis worse. Repeated episodes of bronchitis may cause more lung problems.  HOME CARE Rest. Drink enough fluids to keep your pee (urine) clear or pale yellow (unless you need to limit fluids as told by your doctor). Only take over-the-counter or prescription medicines as told by your doctor. Avoid smoking and secondhand smoke. These can make bronchitis worse. If you are a  smoker, think about using nicotine gum or skin patches. Quitting smoking will help your lungs heal faster. Reduce the chance of getting bronchitis again by: Washing your hands often. Avoiding people with cold symptoms. Trying not to touch your hands to your mouth, nose, or eyes. Follow up with your doctor as told.  GET HELP IF: Your symptoms do not improve after 1 week of treatment. Symptoms include: Cough. Fever. Coughing up thick spit. Body aches. Chest congestion. Chills. Shortness of breath. Sore throat.  GET HELP RIGHT AWAY IF:  You have an increased  fever. You have chills. You have severe shortness of breath. You have bloody thick spit (sputum). You throw up (vomit) often. You lose too much body fluid (dehydration). You have a severe headache. You faint.  MAKE SURE YOU:  Understand these instructions. Will watch your condition. Will get help right away if you are not doing well or get worse. Document Released: 12/28/2007 Document Revised: 03/13/2013 Document Reviewed: 01/01/2013 [facility] Patient Information 2015 [place], [place]. This information is not intended to replace advice given to you by your health care provider. Make sure you discuss any questions you have with your health care provider.    If you have been instructed to have an in-person evaluation today at a local Urgent Care facility, please use the link below. It will take you to a list of all of our available [facility], including address, phone number and hours of operation. Please do not delay care.  [facility]  If you or a family member do not have a primary care provider, use the link below to schedule a visit and establish care. When you choose a [facility] primary care physician or advanced practice provider, you gain a long-term partner in health. Find a Primary Care Provider  Learn more about [facility]'s in-office and virtual care options: [facility] - Get Care Now

## 2021-09-01 ENCOUNTER — Ambulatory Visit: Payer: 59

## 2021-09-01 ENCOUNTER — Other Ambulatory Visit: Payer: Self-pay

## 2021-09-06 ENCOUNTER — Ambulatory Visit: Payer: 59 | Admitting: Physical Therapy

## 2021-09-06 ENCOUNTER — Other Ambulatory Visit: Payer: Self-pay

## 2021-09-06 DIAGNOSIS — M6281 Muscle weakness (generalized): Secondary | ICD-10-CM

## 2021-09-06 DIAGNOSIS — M545 Low back pain, unspecified: Secondary | ICD-10-CM

## 2021-09-06 DIAGNOSIS — M25619 Stiffness of unspecified shoulder, not elsewhere classified: Secondary | ICD-10-CM

## 2021-09-06 DIAGNOSIS — G8929 Other chronic pain: Secondary | ICD-10-CM | POA: Diagnosis not present

## 2021-09-06 DIAGNOSIS — M25512 Pain in left shoulder: Secondary | ICD-10-CM | POA: Diagnosis not present

## 2021-09-06 NOTE — Therapy (Signed)
[place] MAIN [facility] SERVICES [address] [place], [place], [address] Phone: [phone]   Fax:  [phone]  Physical Therapy Treatment  Patient Details  Name: Jesus Carroll MRN: [id number] Date of Birth: 1981-01-18 Referring Provider (PT): Alma Friendly, NP   Encounter Date: 09/06/2021   PT End of Session - 09/06/21 1245     Visit Number 7    Number of Visits 25    Date for PT Re-Evaluation 10/19/21    Authorization Type Initial Certification [id number]- 10/19/2021    Progress Note Due on Visit 10    PT Start Time 0932    PT Stop Time 1015    PT Time Calculation (min) 43 min    Activity Tolerance Patient tolerated treatment well;No increased pain    Behavior During Therapy [facility] for tasks assessed/performed             Past Medical History:  Diagnosis Date   Allergic conjunctivitis 06/03/2019   Coughing/wheezing 06/03/2019    Past Surgical History:  Procedure Laterality Date   MICRODISCECTOMY LUMBAR  2006    There were no vitals filed for this visit.   Subjective Assessment - 09/06/21 1238     Subjective Reports he is doing well. Denies pain.    Pertinent History PER MD NOTE FROM 06/02/2021 submitted by Alma Friendly, NP- HURTIS SCIMECA is a 41 year old male with a past medical history of chronic midline low back pain without sciatica presents for acute shoulder pain and continued lower back pain.   He was last seen for his back pain on 02/18/21 for an acute flare. Plain films of the lumbar spine were obtained which were unremarkable and he trialed cyclobenzaprine at night as well as Ibuprofen as needed to help with muscle relaxation.      Back pain: Pt had discectomy in 2006 and hs had chronic lower back pain since. In the past year, the pain is becoming more problematic. No radiculopathy. He has been taking the cyclobenzaprine, and he reports as being helpful. Activities with bending and/or heavy lifting aggravate the pain.  He also takes 800 mg of Ibuprofen approximately 3 times per week, which he also finds helpful. He also uses [employer] which is helpful.     Shoulder pain: Left shoulder pain onset one month ago at the proximal bicep muscle. He describes it as in intermittent, stabbing pain with activities that require external rotation. He rates the pain as 8/10. He did not have any precipitating traumatic event.    Limitations Sitting;Lifting;Standing;House hold activities;Walking    How long can you sit comfortably? 1 hour    How long can you stand comfortably? 1 hour    How long can you walk comfortably? 40 min    Diagnostic tests 02/2021-IMPRESSION:  No acute osseous abnormality.     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    Patient Stated Goals Full ROM for my shoulder as painfree as possible and improve my stiffness and pain in my back    Currently in Pain? No/denies    Pain Onset More than a month ago              INTERVENTIONS:      Manual Therapy:    Supine manual stretching:     -single knee to chest, x 30 seconds x4  each Leg  -hamstring stretch, 2 x 30 seconds  - Figure 4 x 30 sec x 4 each LE -  Hip circles- 10 reps clockwise/10 reps counterclockwise - Long axis distraction x30 seconds each LE *Right LE presents tighter than left but no reported increase in pain.    Therapeutic Exercises:    Core stabilization with theraball (sm. Green)    Knee to chest- Heels on ball and VC to tighten transverse abd x 15 reps.  Bridging on ball (legs extended) x 15 reps.  Lower trunk rotation- LE on theraball x 12 reps each  Quadruped - alt UE raise with TA contraction x 12 each arm - alt LE raise with TA contraction x 12 each - VC to keep back straight and use of mirror for feedback.  - bilateral knee lift into bear pose x 12 reps  - child's pose x 20 sec hold x 3 reps       -supine rhythmic stabilization LUE at 90 deg and 120 deg x 1 min each. -Serratus punches  using 5# DB - protraction of scapula- at 90 deg 2 x 12 reps on left UE.  -Seated Shoulder ER using GTB x 15 reps Standing Shoulder ABD using GTB x 15 reps  Education provided throughout session via VC/TC and demonstration to facilitate movement at target joints and correct muscle activation for all testing and exercises performed.    Clinical Impression: Pt demonstrates excellent motivation this session. POC was continued and progressed with additional reps and increased challenge in core stabilization. He had difficulty maintaining a stable core noted by weight shifting during single limb lifts in quadruped. No pain reported throughout or at end of session. Patient will benefit from additional skilled PT Intervention to improve strength, ROM and reduce pain with ADLs.         PT Short Term Goals - 07/28/21 1413       PT SHORT TERM GOAL #1   Title Pt will be independent with HEP in order to improve strength and decrease pain in order to improve pain-free function at home and work.    Baseline 07/27/2021- Patient presents with no formal HEP in place.    Time 6    Period Weeks    Status New    Target Date 09/07/21               PT Long Term Goals - 07/28/21 1424       PT LONG TERM GOAL #1   Title Pt will improve FOTO to target score of 64 to display perceived improvements in ability to complete ADL's.    Baseline 07/27/2021=53    Time 12    Period Weeks    Status New    Target Date 10/19/21      PT LONG TERM GOAL #2   Title Pt will decrease quick DASH score by at least 8% in order to demonstrate clinically significant reduction in disability.    Baseline 07/27/2021= 29.5 %    Time 12    Period Weeks    Status New    Target Date 10/19/21      PT LONG TERM GOAL #3   Title Pt will decrease worst pain as reported on NPRS by at least 3 points in order to demonstrate clinically significant reduction in low back and left shoudler pain.    Baseline 07/27/2021= 7/10 reported LPB  and left shoulder pain    Time 12    Period Weeks    Status New    Target Date 10/19/21      PT LONG TERM GOAL #4   Title  Pt will increase Left UE strength of  by at least 1/2 MMT grade in order to demonstrate improvement in strength and function    Baseline 07/27/2021= 2-/5 left shoulder flex/abd    Time 12    Period Weeks    Status New    Target Date 10/19/21                   Plan - 09/06/21 1249     Clinical Impression Statement Pt demonstrates excellent motivation this session. POC was continued and progressed with additional reps and increased challenge in core stabilization. He had difficulty maintaining a stable core noted by weight shifting during single limb lifts in quadruped. No pain reported throughout or at end of session. Patient will benefit from additional skilled PT Intervention to improve strength, ROM and reduce pain with ADLs.    Examination-Activity Limitations Bend;Carry;Lift;Stand;Squat;Reach Overhead    Examination-Participation Restrictions Cleaning;Community Activity;Yard Work    Merchant navy officer Evolving/Moderate complexity    Rehab Potential Good    PT Frequency 2x / week    PT Duration 12 weeks    PT Treatment/Interventions ADLs/Self Care Home Management;Cryotherapy;Electrical Stimulation;Moist Heat;Traction;Stair training;Gait training;Functional mobility training;Therapeutic activities;Therapeutic exercise;Balance training;Neuromuscular re-education;Manual techniques;Passive range of motion;Patient/family education;Dry needling;Spinal Manipulations;Joint Manipulations    PT Next Visit Plan Continue with manual techniques to assist with low back pain/ROM and progress left shoulder strengthening as appropriate.    PT Home Exercise Plan Added prone shoulder flexion prone horizontal abduction and serratus anterior punches followed by extension and flexion of the shoulder.  08/25/2021=Access Code: [email or web address]  - Shoulder ER and ABD (resistive  with TB)    Consulted and Agree with Plan of Care Patient             Patient will benefit from skilled therapeutic intervention in order to improve the following deficits and impairments:  Decreased activity tolerance, Decreased endurance, Decreased strength, Decreased range of motion, Hypomobility, Impaired UE functional use, Pain  Visit Diagnosis: Chronic bilateral low back pain without sciatica  Muscle weakness (generalized)  Chronic left shoulder pain  Limited range of motion of shoulder  Limited range of motion (ROM) of shoulder  Chronic left-sided low back pain without sciatica     Problem List Patient Active Problem List   Diagnosis Date Noted   Shoulder pain, left 06/02/2021   Poison ivy dermatitis 04/01/2021   Family history of thyroid cancer 09/07/2020   Family history of prostate cancer in father 09/07/2020   Chronic midline low back pain without sciatica 07/07/2020   Acute knee pain 07/07/2020   Hyperlipidemia 09/05/2019   Other allergic rhinitis 06/03/2019   Coughing/wheezing 06/03/2019   Preventative health care 12/26/2018    Patrina Levering PT, DPT 09/06/21 12:51 PM [place] MAIN [facility] SERVICES [address] [place], [place], [address] Phone: [phone]   Fax:  [phone]  Name: Jesus Carroll MRN: [id number] Date of Birth: July 07, 1981

## 2021-09-08 ENCOUNTER — Other Ambulatory Visit: Payer: Self-pay

## 2021-09-08 ENCOUNTER — Ambulatory Visit: Payer: 59

## 2021-09-08 DIAGNOSIS — M25619 Stiffness of unspecified shoulder, not elsewhere classified: Secondary | ICD-10-CM

## 2021-09-08 DIAGNOSIS — M25512 Pain in left shoulder: Secondary | ICD-10-CM | POA: Diagnosis not present

## 2021-09-08 DIAGNOSIS — M6281 Muscle weakness (generalized): Secondary | ICD-10-CM

## 2021-09-08 DIAGNOSIS — M545 Low back pain, unspecified: Secondary | ICD-10-CM | POA: Diagnosis not present

## 2021-09-08 DIAGNOSIS — G8929 Other chronic pain: Secondary | ICD-10-CM

## 2021-09-08 NOTE — Therapy (Signed)
[place] [facility] MAIN [facility] SERVICES [address] [place], [place], [address] Phone: [phone]   Fax:  [phone]  Physical Therapy Treatment  Patient Details  Name: Jesus Carroll MRN: [id number] Date of Birth: [birth year] Referring Provider (PT): Vernona Rieger, NP   Encounter Date: 09/08/2021   PT End of Session - 09/08/21 1847     Visit Number 8    Number of Visits 25    Date for PT Re-Evaluation 10/19/21    Authorization Type Initial Certification 07/27/2021- 10/19/2021    Progress Note Due on Visit 10    PT Start Time 1107    PT Stop Time 1146    PT Time Calculation (min) 39 min    Activity Tolerance Patient tolerated treatment well;No increased pain    Behavior During Therapy [facility] for tasks assessed/performed             Past Medical History:  Diagnosis Date   Allergic conjunctivitis 06/03/2019   Coughing/wheezing 06/03/2019    Past Surgical History:  Procedure Laterality Date   MICRODISCECTOMY LUMBAR  2006    There were no vitals filed for this visit.   Subjective Assessment - 09/08/21 1845     Subjective Pt reports taking ibuprofen prior to appointment due to low back pain. Reports his back is feeling OK at the moment.    Pertinent History PER MD NOTE FROM 06/02/2021 submitted by Vernona Rieger, NP- Jesus Carroll is a 41 year old male with a past medical history of chronic midline low back pain without sciatica presents for acute shoulder pain and continued lower back pain.   He was last seen for his back pain on 02/18/21 for an acute flare. Plain films of the lumbar spine were obtained which were unremarkable and he trialed cyclobenzaprine at night as well as Ibuprofen as needed to help with muscle relaxation.      Back pain: Pt had discectomy in 2006 and hs had chronic lower back pain since. In the past year, the pain is becoming more problematic. No radiculopathy. He has been taking the cyclobenzaprine, and he reports as  being helpful. Activities with bending and/or heavy lifting aggravate the pain. He also takes 800 mg of Ibuprofen approximately 3 times per week, which he also finds helpful. He also uses [employer] which is helpful.     Shoulder pain: Left shoulder pain onset one month ago at the proximal bicep muscle. He describes it as in intermittent, stabbing pain with activities that require external rotation. He rates the pain as 8/10. He did not have any precipitating traumatic event.    Limitations Sitting;Lifting;Standing;House hold activities;Walking    How long can you sit comfortably? 1 hour    How long can you stand comfortably? 1 hour    How long can you walk comfortably? 40 min    Diagnostic tests 02/2021-IMPRESSION:  No acute osseous abnormality.     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    Patient Stated Goals Full ROM for my shoulder as painfree as possible and improve my stiffness and pain in my back    Currently in Pain? No/denies    Pain Onset More than a month ago              INTERVENTIONS:      Manual Therapy:    Pt supine on mat table, PT provides manual stretching:     -single knee to chest, 2 x 30 seconds each LE  -  hamstring stretch, 2 x 30 seconds each LE - Figure 2 x 30 sec each LE  Pt reports no pain with stretching.   Therapeutic Exercises:   PT instructs pt in seated hamstring stretch with sciatic nerve floss 2x30 sec each LE PT instructs pt in seated figure four stretch x30 sec each LE   Supine on mat table: Lower trunk rotations x multiple reps within pain-free range Glute bridge 2x10; cuing for TA activation with intervention Dead bug progression with LE marching (1-2 sec hold) and TA activation, pelvic tilts - 1x6 each LE, 1x4 each LE; pt challenged in sustaining pelvic tilt Dead bug progression with SLR 1x6 each LE. Pt rates as fatiguing.  Green p.ball hamstring curl 15x Green p.ball glute bridge 10x  Pt in quadruped - alt UE  raise with TA contraction x 10 each arm - alt LE raise with TA contraction x 6 each LE - Continued VC for pelvic stabilization/technique   Standing at support surface: Marching 2x10; pt rates as easy + Standing hip abduction 2x10 each LE - very challenging, pt reports intervention is fatiguing Pt reports no pain with standing interventions  Seated shoulder shrug 10x B, cuing for control with concentric/eccentric Seated scapular squeezes 10x B    Education provided throughout session via VC/TC and demonstration to facilitate movement at target joints and correct muscle activation for all testing and exercises performed.       PT Education - 09/08/21 1846     Education Details exercise technique, body mechanics    Person(s) Educated Patient    Methods Explanation;Demonstration;Tactile cues;Verbal cues    Comprehension Verbalized understanding;Returned demonstration;Verbal cues required;Tactile cues required;Need further instruction              PT Short Term Goals - 07/28/21 1413       PT SHORT TERM GOAL #1   Title Pt will be independent with HEP in order to improve strength and decrease pain in order to improve pain-free function at home and work.    Baseline 07/27/2021- Patient presents with no formal HEP in place.    Time 6    Period Weeks    Status New    Target Date 09/07/21               PT Long Term Goals - 07/28/21 1424       PT LONG TERM GOAL #1   Title Pt will improve FOTO to target score of 64 to display perceived improvements in ability to complete ADL's.    Baseline 07/27/2021=53    Time 12    Period Weeks    Status New    Target Date 10/19/21      PT LONG TERM GOAL #2   Title Pt will decrease quick DASH score by at least 8% in order to demonstrate clinically significant reduction in disability.    Baseline 07/27/2021= 29.5 %    Time 12    Period Weeks    Status New    Target Date 10/19/21      PT LONG TERM GOAL #3   Title Pt will decrease worst  pain as reported on NPRS by at least 3 points in order to demonstrate clinically significant reduction in low back and left shoudler pain.    Baseline 07/27/2021= 7/10 reported LPB and left shoulder pain    Time 12    Period Weeks    Status New    Target Date 10/19/21      PT LONG  TERM GOAL #4   Title Pt will increase Left UE strength of  by at least 1/2 MMT grade in order to demonstrate improvement in strength and function    Baseline 07/27/2021= 2-/5 left shoulder flex/abd    Time 12    Period Weeks    Status New    Target Date 10/19/21                   Plan - 09/08/21 1901     Clinical Impression Statement Pt highly motivated to participate in session. He responded well to majority of interventions without increased pain. Only quadruped UE reach had to be modified (decreased ROM of UE reach) to perform within a pain-free range. The pt was able to perform standing therex. The pt found standing hip abduction to be the most fatiguing and challenging intervention. The pt will continue to benefit from further skilled PT to reduce pain and to increase strength and mobility in order to improve QOL.    Examination-Activity Limitations Bend;Carry;Lift;Stand;Squat;Reach Overhead    Examination-Participation Restrictions Cleaning;Community Activity;Yard Work    Conservation officer, historic buildings Evolving/Moderate complexity    Rehab Potential Good    PT Frequency 2x / week    PT Duration 12 weeks    PT Treatment/Interventions ADLs/Self Care Home Management;Cryotherapy;Electrical Stimulation;Moist Heat;Traction;Stair training;Gait training;Functional mobility training;Therapeutic activities;Therapeutic exercise;Balance training;Neuromuscular re-education;Manual techniques;Passive range of motion;Patient/family education;Dry needling;Spinal Manipulations;Joint Manipulations    PT Next Visit Plan Continue with manual techniques to assist with low back pain/ROM and progress left shoulder  strengthening as appropriate. Continue POC as previously indicated    PT Home Exercise Plan Added prone shoulder flexion prone horizontal abduction and serratus anterior punches followed by extension and flexion of the shoulder.  08/25/2021=Access Code: [email or web address]  - Shoulder ER and ABD (resistive with TB); no updates    Consulted and Agree with Plan of Care Patient             Patient will benefit from skilled therapeutic intervention in order to improve the following deficits and impairments:  Decreased activity tolerance, Decreased endurance, Decreased strength, Decreased range of motion, Hypomobility, Impaired UE functional use, Pain  Visit Diagnosis: Muscle weakness (generalized)  Chronic left shoulder pain  Limited range of motion of shoulder  Chronic bilateral low back pain without sciatica     Problem List Patient Active Problem List   Diagnosis Date Noted   Shoulder pain, left 06/02/2021   Poison ivy dermatitis 04/01/2021   Family history of thyroid cancer 09/07/2020   Family history of prostate cancer in father 09/07/2020   Chronic midline low back pain without sciatica 07/07/2020   Acute knee pain 07/07/2020   Hyperlipidemia 09/05/2019   Other allergic rhinitis 06/03/2019   Coughing/wheezing 06/03/2019   Preventative health care 12/26/2018    Baird Kay, PT 09/08/2021, 7:09 PM   [facility] MAIN [facility] SERVICES [address] [place], [place], [address] Phone: [phone]   Fax:  [phone]  Name: Jesus Carroll MRN: [id number] Date of Birth: [birth year]

## 2021-09-13 ENCOUNTER — Ambulatory Visit: Payer: 59

## 2021-09-13 ENCOUNTER — Other Ambulatory Visit: Payer: Self-pay

## 2021-09-13 DIAGNOSIS — M545 Low back pain, unspecified: Secondary | ICD-10-CM

## 2021-09-13 DIAGNOSIS — G8929 Other chronic pain: Secondary | ICD-10-CM

## 2021-09-13 DIAGNOSIS — M6281 Muscle weakness (generalized): Secondary | ICD-10-CM

## 2021-09-13 DIAGNOSIS — M25619 Stiffness of unspecified shoulder, not elsewhere classified: Secondary | ICD-10-CM | POA: Diagnosis not present

## 2021-09-13 DIAGNOSIS — M25512 Pain in left shoulder: Secondary | ICD-10-CM | POA: Diagnosis not present

## 2021-09-13 NOTE — Therapy (Signed)
[facility] MAIN [facility] SERVICES [address] [place], [place], [address] Phone: [phone]   Fax:  [phone]  Physical Therapy Treatment  Patient Details  Name: Jesus Carroll MRN: [id number] Date of Birth: 12/27/1980 Referring Provider (PT): Vernona RiegerKatherine Clark, NP   Encounter Date: 09/13/2021   PT End of Session - 09/14/21 0819     Visit Number 9    Number of Visits 25    Date for PT Re-Evaluation 10/19/21    Authorization Type Initial Certification 07/27/2021- 10/19/2021    Progress Note Due on Visit 10    PT Start Time 0930    PT Stop Time 1013    PT Time Calculation (min) 43 min    Activity Tolerance Patient tolerated treatment well;No increased pain    Behavior [facility] Therapy [facility] for tasks assessed/performed             Past Medical History:  Diagnosis Date   Allergic conjunctivitis 06/03/2019   Coughing/wheezing 06/03/2019    Past Surgical History:  Procedure Laterality Date   MICRODISCECTOMY LUMBAR  2006    There were no vitals filed for this visit.   Subjective Assessment - 09/13/21 0924     Subjective Pt reports minimal increase in back pain following previous appointment. Pt reports he was able to sit through a baseball game with his son. Pt reports no pain currently and that he hasn't taken an ibuprofen yet today. Pt reports L shoulder feels good currently and that his ROM has improved. However, pt reports lifting things with weight causes strain still. Pt reports strain with lifting 5#. Pt reports it is not a painful strain, maybe at most a 1/10.    Pertinent History PER MD NOTE FROM 06/02/2021 submitted by Vernona RiegerKatherine Clark, NP- Jesus MaineStephen F Carroll is a 41 year old male with a past medical history of chronic midline low back pain without sciatica presents for acute shoulder pain and continued lower back pain.   He was last seen for his back pain on 02/18/21 for an acute flare. Plain films of the lumbar spine were obtained which  were unremarkable and he trialed cyclobenzaprine at night as well as Ibuprofen as needed to help with muscle relaxation.      Back pain: Pt had discectomy in 2006 and hs had chronic lower back pain since. In the past year, the pain is becoming more problematic. No radiculopathy. He has been taking the cyclobenzaprine, and he reports as being helpful. Activities with bending and/or heavy lifting aggravate the pain. He also takes 800 mg of Ibuprofen approximately 3 times per week, which he also finds helpful. He also uses [employer] which is helpful.     Shoulder pain: Left shoulder pain onset one month ago at the proximal bicep muscle. He describes it as in intermittent, stabbing pain with activities that require external rotation. He rates the pain as 8/10. He did not have any precipitating traumatic event.    Limitations Sitting;Lifting;Standing;House hold activities;Walking    How long can you sit comfortably? 1 hour    How long can you stand comfortably? 1 hour    How long can you walk comfortably? 40 min    Diagnostic tests 02/2021-IMPRESSION:  No acute osseous abnormality.     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    Patient Stated Goals Full ROM for my shoulder as painfree as possible and improve my stiffness and pain in my back    Currently in  Pain? No/denies    Pain Onset More than a month ago               INTERVENTIONS:   Pt hook-lying on plinth:  Lower trunk rotations AAROM 20x --AROM 20x, able to perform through greater ROM pain-free Knee to chest stretch 2x30-45 sec each LE - PT-assisted Supine Piriformis stretch 1x60 sec each LE: pt reports "not as limber" going to the R Hamstring stretch, 2 x 30 seconds each LE, PT- assisted  Glute bridge with pelvic tilt and TA activation 2x15; pt reports no pain with intervention; pt rates as easy  Dead bug progression with LE marching (1-2 sec holds) and TA activation, pelvic tilts - 1x16; pt challenged with  sustaining pelvic tilt --progressed to performing with alternating UE reaches, pt able to perform pain-free  Dead bug progression with SLR 1x6 each LE.  --progressed to addition of alternating UE reaches. Pt continues to rate as fatiguing.   Standing hip abduction 2x10 each LE; pt rates as medium   Standing march 2x20 alternating LEs  Standing hip ext. 2x10 each LE; Pt reports feeling slight tightness/cramp in R low back  Standing lat stretch 1x20 sec B  Seated P.ball rollouts forward/backward 10x with 1 sec hold end range; focus on back and shoudler mobility/stretch Seated P.ball laterall rollouts 10x. Continued focus on shoulder and spinal mobility, symptom modulation.   Addition to HEP. Instruction to perform within pain-free range. Discontinue if increasingly irritating to low back: Access Code: [email or web address] URL: [email or web address] Date: 09/13/2021 Prepared by: Jesus Carroll  Exercises Standing Hip Abduction - 1 x daily - 5 x weekly - 2 sets - 10 reps - 1 hold Standing Hip Extension - 1 x daily - 5 x weekly - 2 sets - 10 reps - 1 hold Seated Bilateral Shoulder Flexion Towel Slide at Table Top - 1 x daily - 7 x weekly - 2 sets - 10 reps    Education provided throughout session via VC/TC and demonstration to facilitate movement at target joints and correct muscle activation for all testing and exercises performed.     PT Education - 09/14/21 0819     Education Details exercise technique, body mechanics    Person(s) Educated Patient    Methods Explanation;Demonstration;Verbal cues    Comprehension Verbalized understanding;Returned demonstration;Verbal cues required;Need further instruction              PT Short Term Goals - 07/28/21 1413       PT SHORT TERM GOAL #1   Title Pt will be independent with HEP in order to improve strength and decrease pain in order to improve pain-free function at home and work.    Baseline 07/27/2021- Patient presents with no  formal HEP in place.    Time 6    Period Weeks    Status New    Target Date 09/07/21               PT Long Term Goals - 07/28/21 1424       PT LONG TERM GOAL #1   Title Pt will improve FOTO to target score of 64 to display perceived improvements in ability to complete ADL's.    Baseline 07/27/2021=53    Time 12    Period Weeks    Status New    Target Date 10/19/21      PT LONG TERM GOAL #2   Title Pt will decrease quick DASH score by at least 8% in order to demonstrate clinically  significant reduction in disability.    Baseline 07/27/2021= 29.5 %    Time 12    Period Weeks    Status New    Target Date 10/19/21      PT LONG TERM GOAL #3   Title Pt will decrease worst pain as reported on NPRS by at least 3 points in order to demonstrate clinically significant reduction in low back and left shoudler pain.    Baseline 07/27/2021= 7/10 reported LPB and left shoulder pain    Time 12    Period Weeks    Status New    Target Date 10/19/21      PT LONG TERM GOAL #4   Title Pt will increase Left UE strength of  by at least 1/2 MMT grade in order to demonstrate improvement in strength and function    Baseline 07/27/2021= 2-/5 left shoulder flex/abd    Time 12    Period Weeks    Status New    Target Date 10/19/21                   Plan - 09/14/21 7989     Clinical Impression Statement Pt continues to tolerate more advanced LE endurance/strength interventions with minimal increases in low back discomfort. PT added standing interventions to HEP with addition of mobility intervention for symptom modulation of back and shoulder. The pt will benefit from further skilled PT to continue to reduce pain, and to increase strength and mobility in order to return to PLOF and increase QOL.    Examination-Activity Limitations Bend;Carry;Lift;Stand;Squat;Reach Overhead    Examination-Participation Restrictions Cleaning;Community Activity;Yard Work    Conservation officer, historic buildings  Evolving/Moderate complexity    Rehab Potential Good    PT Frequency 2x / week    PT Duration 12 weeks    PT Treatment/Interventions ADLs/Self Care Home Management;Cryotherapy;Electrical Stimulation;Moist Heat;Traction;Stair training;Gait training;Functional mobility training;Therapeutic activities;Therapeutic exercise;Balance training;Neuromuscular re-education;Manual techniques;Passive range of motion;Patient/family education;Dry needling;Spinal Manipulations;Joint Manipulations    PT Next Visit Plan Continue with manual techniques to assist with low back pain/ROM and progress left shoulder strengthening as appropriate. Continue POC as previously indicated    PT Home Exercise Plan Added prone shoulder flexion prone horizontal abduction and serratus anterior punches followed by extension and flexion of the shoulder.  08/25/2021=Access Code: [email or web address]  - Shoulder ER and ABD (resistive with TB); 2/20: Access Code: [email or web address]    Consulted and Agree with Plan of Care Patient             Patient will benefit from skilled therapeutic intervention in order to improve the following deficits and impairments:  Decreased activity tolerance, Decreased endurance, Decreased strength, Decreased range of motion, Hypomobility, Impaired UE functional use, Pain  Visit Diagnosis: Muscle weakness (generalized)  Chronic bilateral low back pain without sciatica     Problem List Patient Active Problem List   Diagnosis Date Noted   Shoulder pain, left 06/02/2021   Poison ivy dermatitis 04/01/2021   Family history of thyroid cancer 09/07/2020   Family history of prostate cancer in father 09/07/2020   Chronic midline low back pain without sciatica 07/07/2020   Acute knee pain 07/07/2020   Hyperlipidemia 09/05/2019   Other allergic rhinitis 06/03/2019   Coughing/wheezing 06/03/2019   Preventative health care 12/26/2018    Jesus Carroll, PT 09/14/2021, 8:30 AM  [facility]  MAIN [facility] SERVICES [address] [place], [place], [address] Phone: [phone]   Fax:  [phone]  Name: Jesus Carroll MRN:  [id number] Date of Birth: 04-09-1981

## 2021-09-15 ENCOUNTER — Ambulatory Visit: Payer: 59 | Admitting: Physical Therapy

## 2021-09-15 ENCOUNTER — Other Ambulatory Visit: Payer: Self-pay

## 2021-09-15 ENCOUNTER — Encounter: Payer: Self-pay | Admitting: Physical Therapy

## 2021-09-15 DIAGNOSIS — G8929 Other chronic pain: Secondary | ICD-10-CM | POA: Diagnosis not present

## 2021-09-15 DIAGNOSIS — M25619 Stiffness of unspecified shoulder, not elsewhere classified: Secondary | ICD-10-CM

## 2021-09-15 DIAGNOSIS — M6281 Muscle weakness (generalized): Secondary | ICD-10-CM | POA: Diagnosis not present

## 2021-09-15 DIAGNOSIS — M25512 Pain in left shoulder: Secondary | ICD-10-CM | POA: Diagnosis not present

## 2021-09-15 DIAGNOSIS — M545 Low back pain, unspecified: Secondary | ICD-10-CM | POA: Diagnosis not present

## 2021-09-15 NOTE — Therapy (Signed)
[place] MAIN [facility] SERVICES [address] [place], [place], [address] Phone: [phone]   Fax:  [phone]  Physical Therapy Treatment Physical Therapy Progress Note   Dates of reporting period  07/27/21   to   09/15/21    Patient Details  Name: Jesus Carroll MRN: [id number] Date of Birth: [birth year] Referring Provider (PT): Alma Friendly, NP   Encounter Date: 09/15/2021   PT End of Session - 09/15/21 0938     Visit Number 10    Number of Visits 25    Date for PT Re-Evaluation 10/19/21    Authorization Type Initial Certification 12/27/5372- 10/19/2021    Progress Note Due on Visit 10    PT Start Time 0932    PT Stop Time 1015    PT Time Calculation (min) 43 min    Activity Tolerance Patient tolerated treatment well;No increased pain    Behavior During Therapy [facility] for tasks assessed/performed             Past Medical History:  Diagnosis Date   Allergic conjunctivitis 06/03/2019   Coughing/wheezing 06/03/2019    Past Surgical History:  Procedure Laterality Date   MICRODISCECTOMY LUMBAR  2006    There were no vitals filed for this visit.   Subjective Assessment - 09/15/21 0936     Subjective Patient reports no pain currently. 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    Pertinent History PER MD NOTE FROM 06/02/2021 submitted by Alma Friendly, NP- Jesus Carroll is a 41 year old male with a past medical history of chronic midline low back pain without sciatica presents for acute shoulder pain and continued lower back pain.   He was last seen for his back pain on 02/18/21 for an acute flare. Plain films of the lumbar spine were obtained which were unremarkable and he trialed cyclobenzaprine at night as well as Ibuprofen as needed to help with muscle relaxation.      Back pain: Pt had  discectomy in 2006 and hs had chronic lower back pain since. In the past year, the pain is becoming more problematic. No radiculopathy. He has been taking the cyclobenzaprine, and he reports as being helpful. Activities with bending and/or heavy lifting aggravate the pain. He also takes 800 mg of Ibuprofen approximately 3 times per week, which he also finds helpful. He also uses [employer] which is helpful.     Shoulder pain: Left shoulder pain onset one month ago at the proximal bicep muscle. He describes it as in intermittent, stabbing pain with activities that require external rotation. He rates the pain as 8/10. He did not have any precipitating traumatic event.    Limitations Sitting;Lifting;Standing;House hold activities;Walking    How long can you sit comfortably? 1 hour    How long can you stand comfortably? 1 hour    How long can you walk comfortably? 40 min    Diagnostic tests 02/2021-IMPRESSION:  No acute osseous abnormality.     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    Patient Stated Goals Full ROM for my shoulder as painfree as possible and improve my stiffness and pain in my back    Currently in Pain? No/denies    Pain Onset More than a month ago  TREATMENT: Hooklying positioning; Lumbar trunk rotation with legs apart (windshield wiper) x10 reps Single knee to chest stretch 15 sec hold x1 reps  Progressed to hip circles AROM x5 reps each direction Foot crossed over opposite LE hip adduction/flexion x5 reps AROM for piriformis/IT band stretch; Double knee to chest stretch with legs apart  Side/side rock x10 sec  Progressed to barrel roll x3 reps each direction Posterior pelvic tilt 5 sec hold x10 reps;   Shoulder ROM: AROM (at eval) R/L WNL/132* deg Shoulder flexion 180/ (pain starting at 65 *deg and limited to 85 deg L Shoulder abduction 90/70* Shoulder external rotation 70/70 Shoulder internal rotation 60/60 Shoulder  extension *Indicates pain, overpressure performed unless otherwise indicated    AROM (today- 09/15/21) R/L WNL/162 deg Shoulder flexion- no pain 180/ 172 L Shoulder abduction- no pain  90//90 Shoulder external rotation 70/70 Shoulder internal rotation  Strength (at eval on 07/27/21) R/L 5/2- Shoulder flexion (anterior deltoid/pec major/coracobrachialis, axillary n. (C5-6) and musculocutaneous n. (C5-7)) 5/2- Shoulder abduction (deltoid/supraspinatus, axillary/suprascapular n, C5) 5/4 Shoulder external rotation (infraspinatus/teres minor) 5/5 Shoulder internal rotation (subcapularis/lats/pec major) 5/5 Shoulder extension (posterior deltoid, lats, teres major, axillary/thoracodorsal n.) 5/5 Shoulder horizontal abduction 5/5 Elbow flexion (biceps brachii, brachialis, brachioradialis, musculoskeletal n, C5-6) 5/5 Elbow extension (triceps, radial n, C7) 5/5 Wrist Extension 5/5 Wrist Flexion 5/5 Finger adduction (interossei, ulnar n, T1)  Strength (today 09/15/21) R/L 5/4+Shoulder flexion (anterior deltoid/pec major/coracobrachialis, axillary n. (C5-6) and musculocutaneous n. (C5-7)) 5/3+ *Shoulder abduction (deltoid/supraspinatus, axillary/suprascapular n, C5) 5/5 Shoulder external rotation (infraspinatus/teres minor)- no pain 5/4* Shoulder internal rotation (subcapularis/lats/pec major) 5/5 Shoulder extension (posterior deltoid, lats, teres major, axillary/thoracodorsal n.)   Lumbar ROM: Flexion 45 degrees Extension 30 degrees Lateral flexion: 25 degrees bilaterally No pain;     PT assessed progress towards goals, looking at UE ROM/strength, lumbar ROM, FOTO and Quick DASH scores  Patient's condition has the potential to improve in response to therapy. Maximum improvement is yet to be obtained. The anticipated improvement is attainable and reasonable in a generally predictable time.  Patient reports adherence with HEP daily; He still has not resumed recreational activities due to  weather and concern of pain;   Reports not lifting heavy items due to compression leading to increased back pain  Hasn't returned to golf but is concerned rotation/golf could lead to discomfort;  Still having pain with prolonged standing- taking ibuprofen to help alleviate symptoms;                              PT Short Term Goals - 09/15/21 0945       PT SHORT TERM GOAL #1   Title Pt will be independent with HEP in order to improve strength and decrease pain in order to improve pain-free function at home and work.    Baseline 07/27/2021- Patient presents with no formal HEP in place., 2/22: doing 2x a day    Time 6    Period Weeks    Status Achieved    Target Date 09/07/21               PT Long Term Goals - 09/15/21 0945       PT LONG TERM GOAL #1   Title Pt will improve FOTO to target score of 64 to display perceived improvements in ability to complete ADL's.    Baseline 07/27/2021=53, 2/22: 54%    Time 12    Period Weeks    Status  Partially Met    Target Date 10/19/21      PT LONG TERM GOAL #2   Title Pt will decrease quick DASH score by at least 8% in order to demonstrate clinically significant reduction in disability.    Baseline 07/27/2021= 29.5 %, 2/22: 20.4%    Time 12    Period Weeks    Status Achieved    Target Date 10/19/21      PT LONG TERM GOAL #3   Title Pt will decrease worst pain as reported on NPRS by at least 3 points in order to demonstrate clinically significant reduction in low back and left shoudler pain.    Baseline 07/27/2021= 7/10 reported LPB and left shoulder pain, 2/22: worst pain 3/10 in low back, 0.5/10 in shoulder    Time 12    Period Weeks    Status Achieved    Target Date 10/19/21      PT LONG TERM GOAL #4   Title Pt will increase Left UE strength in shoulder abduction/flexion to 4/5 in order to demonstrate improvement in strength and function for lifting and carrying.    Baseline 07/27/2021= 2-/5 left shoulder  flex/abd, 2/22: LUE: 4+ flexion, 3+ abduction, 4/5 IR;    Time 12    Period Weeks    Status Revised    Target Date 10/19/21      PT LONG TERM GOAL #5   Title Patient will exhibits full rotation ROM in thoracolumbar spine without pain after 10+ repetitions to demonstrate improvement in tolerance with functional/recreational activities such as return to golf and playing with son.    Time 12    Period Weeks    Status New    Target Date 10/19/21                   Plan - 09/15/21 1056     Clinical Impression Statement Patient motivated and participated well within session. he exhibits good ROM in lumbar spine and LUE shoulder. He does exhibit mild weakness in LUE shoulder with pain during resisted shoulder abduction. Patient reports he has not resumed recreational activities partly from weather and also from fear avoidance to avoid pain in back. He reports discomfort with repetitive lifting and compression to spine. He also would like to return to golf activities. Patient's pain has been management to 3/10 or less in back and minimal to none in shoulder with general day to day activities. He would benefit from additional skilled PT intervention to improve strength in UE and improve flexibility in thoracolumbar spine with core stabilization to reduce discomfort with return to functional tasks. Plan to reduce frequency to 1x a week and will address HEP each session;    Examination-Activity Limitations Bend;Carry;Lift;Stand;Squat;Reach Overhead    Examination-Participation Restrictions Cleaning;Community Activity;Yard Work    Merchant navy officer Evolving/Moderate complexity    Rehab Potential Good    PT Frequency 2x / week    PT Duration 12 weeks    PT Treatment/Interventions ADLs/Self Care Home Management;Cryotherapy;Electrical Stimulation;Moist Heat;Traction;Stair training;Gait training;Functional mobility training;Therapeutic activities;Therapeutic exercise;Balance  training;Neuromuscular re-education;Manual techniques;Passive range of motion;Patient/family education;Dry needling;Spinal Manipulations;Joint Manipulations    PT Next Visit Plan Continue with manual techniques to assist with low back pain/ROM and progress left shoulder strengthening as appropriate. Continue POC as previously indicated    PT Home Exercise Plan Added prone shoulder flexion prone horizontal abduction and serratus anterior punches followed by extension and flexion of the shoulder.  08/25/2021=Access Code: [email or web address]  - Shoulder ER and ABD (resistive  with TB); 2/20: Access Code: [email or web address]    Consulted and Agree with Plan of Care Patient             Patient will benefit from skilled therapeutic intervention in order to improve the following deficits and impairments:  Decreased activity tolerance, Decreased endurance, Decreased strength, Decreased range of motion, Hypomobility, Impaired UE functional use, Pain  Visit Diagnosis: Muscle weakness (generalized)  Chronic bilateral low back pain without sciatica  Chronic left shoulder pain  Limited range of motion of shoulder  Limited range of motion (ROM) of shoulder  Chronic left-sided low back pain without sciatica     Problem List Patient Active Problem List   Diagnosis Date Noted   Shoulder pain, left 06/02/2021   Poison ivy dermatitis 04/01/2021   Family history of thyroid cancer 09/07/2020   Family history of prostate cancer in father 09/07/2020   Chronic midline low back pain without sciatica 07/07/2020   Acute knee pain 07/07/2020   Hyperlipidemia 09/05/2019   Other allergic rhinitis 06/03/2019   Coughing/wheezing 06/03/2019   Preventative health care 12/26/2018    Jesus Carroll, PT, DPT 09/15/2021, 11:01 AM  [place] MAIN [facility] SERVICES [address] [place], [place], [address] Phone: [phone]   Fax:  [phone]  Name: Jesus Carroll MRN: [id number] Date  of Birth: Jan 25, 1981

## 2021-09-21 ENCOUNTER — Ambulatory Visit: Payer: 59

## 2021-09-23 ENCOUNTER — Ambulatory Visit: Payer: 59 | Attending: Primary Care

## 2021-09-23 ENCOUNTER — Other Ambulatory Visit: Payer: Self-pay

## 2021-09-23 DIAGNOSIS — M25619 Stiffness of unspecified shoulder, not elsewhere classified: Secondary | ICD-10-CM | POA: Diagnosis not present

## 2021-09-23 DIAGNOSIS — M6281 Muscle weakness (generalized): Secondary | ICD-10-CM | POA: Diagnosis not present

## 2021-09-23 DIAGNOSIS — M25512 Pain in left shoulder: Secondary | ICD-10-CM | POA: Diagnosis not present

## 2021-09-23 DIAGNOSIS — M545 Low back pain, unspecified: Secondary | ICD-10-CM | POA: Insufficient documentation

## 2021-09-23 DIAGNOSIS — G8929 Other chronic pain: Secondary | ICD-10-CM | POA: Diagnosis not present

## 2021-09-23 NOTE — Therapy (Signed)
[place] MAIN [facility] SERVICES [address] [place], [place], [address] Phone: [phone]   Fax:  [phone]  Physical Therapy Treatment  Patient Details  Name: Jesus Carroll MRN: [id number] Date of Birth: [birth year] Referring Provider (PT): Alma Friendly, NP   Encounter Date: 09/23/2021   PT End of Session - 09/24/21 1125     Visit Number 11    Number of Visits 25    Date for PT Re-Evaluation 10/19/21    Authorization Type Initial Certification 4/0/1027- 10/19/2021    Progress Note Due on Visit 10    PT Start Time 1440    PT Stop Time 1515    PT Time Calculation (min) 35 min    Activity Tolerance Patient tolerated treatment well;No increased pain    Behavior During Therapy [facility] for tasks assessed/performed             Past Medical History:  Diagnosis Date   Allergic conjunctivitis 06/03/2019   Coughing/wheezing 06/03/2019    Past Surgical History:  Procedure Laterality Date   MICRODISCECTOMY LUMBAR  2006    There were no vitals filed for this visit.   Subjective Assessment - 09/24/21 1127     Subjective Pt reports overall improvement in his symptoms. He reports current pain is 0/10. Pt would like to prepare to return to golfing, hiking.    Pertinent History PER MD NOTE FROM 06/02/2021 submitted by Alma Friendly, NP- Jesus Carroll is a 41 year old male with a past medical history of chronic midline low back pain without sciatica presents for acute shoulder pain and continued lower back pain.   He was last seen for his back pain on 02/18/21 for an acute flare. Plain films of the lumbar spine were obtained which were unremarkable and he trialed cyclobenzaprine at night as well as Ibuprofen as needed to help with muscle relaxation.      Back pain: Pt had discectomy in 2006 and hs had chronic lower back pain since. In the past year, the pain is becoming more problematic. No radiculopathy. He has been taking the cyclobenzaprine,  and he reports as being helpful. Activities with bending and/or heavy lifting aggravate the pain. He also takes 800 mg of Ibuprofen approximately 3 times per week, which he also finds helpful. He also uses [employer] which is helpful.     Shoulder pain: Left shoulder pain onset one month ago at the proximal bicep muscle. He describes it as in intermittent, stabbing pain with activities that require external rotation. He rates the pain as 8/10. He did not have any precipitating traumatic event.    Limitations Sitting;Lifting;Standing;House hold activities;Walking    How long can you sit comfortably? 1 hour    How long can you stand comfortably? 1 hour    How long can you walk comfortably? 40 min    Diagnostic tests 02/2021-IMPRESSION:  No acute osseous abnormality.     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    Patient Stated Goals Full ROM for my shoulder as painfree as possible and improve my stiffness and pain in my back    Currently in Pain? No/denies    Pain Onset More than a month ago                INTERVENTIONS:    Pt hook-lying on plinth:  Lower trunk rotations AAROM 20x;  PPT with TA activation and SLR 2x10 each LE - somewhat challenging Knee to chest  stretch 30 sec each LE Piriformis stretch 30 sec each LE Comments: no pain with interventions.   Standing hip abduction 15x each LE; pt reports feeling some cramping starting R low back. Discontinued to remain within symptom tolerance.  Seated p.ball rollouts forward/backward and laterally for improved mobility throughout thoracolumbar spine and L shoulder.   Mini-squats at bar 2x10. Pt performs within partial ROM. No pain with intervention.  Leg press 25# 10x, 40# 10x, 55# 12x. No pain with intervention.   Treadmill cardiorespiratory and LE muscular endurance training, additional assessment to determine if pt symptomatic with intervention as a progression to prepare pt for hiking - pt ambulates up to  1.8 mph, elevation level 5 x 5 minutes.  Pt reports no symptoms with intervention.   Deadlift from increased height (mat table) to decrease ROM - 9# 5x, 18# 5x. No pain with intervention. --Pt progressed intervention to performing at matrix cable machine (height 2) with 12.5# each side for 10 reps.. Pt reports no pain with intervention.   Education provided throughout session via VC/TC and demonstration to facilitate movement at target joints and correct muscle activation for all testing and exercises performed.       PT Education - 09/24/21 1001     Education Details exercise technique    Person(s) Educated Patient    Methods Explanation;Demonstration;Verbal cues    Comprehension Verbalized understanding;Returned demonstration;Need further instruction              PT Short Term Goals - 09/15/21 0945       PT SHORT TERM GOAL #1   Title Pt will be independent with HEP in order to improve strength and decrease pain in order to improve pain-free function at home and work.    Baseline 07/27/2021- Patient presents with no formal HEP in place., 2/22: doing 2x a day    Time 6    Period Weeks    Status Achieved    Target Date 09/07/21               PT Long Term Goals - 09/15/21 0945       PT LONG TERM GOAL #1   Title Pt will improve FOTO to target score of 64 to display perceived improvements in ability to complete ADL's.    Baseline 07/27/2021=53, 2/22: 54%    Time 12    Period Weeks    Status Partially Met    Target Date 10/19/21      PT LONG TERM GOAL #2   Title Pt will decrease quick DASH score by at least 8% in order to demonstrate clinically significant reduction in disability.    Baseline 07/27/2021= 29.5 %, 2/22: 20.4%    Time 12    Period Weeks    Status Achieved    Target Date 10/19/21      PT LONG TERM GOAL #3   Title Pt will decrease worst pain as reported on NPRS by at least 3 points in order to demonstrate clinically significant reduction in low back and  left shoudler pain.    Baseline 07/27/2021= 7/10 reported LPB and left shoulder pain, 2/22: worst pain 3/10 in low back, 0.5/10 in shoulder    Time 12    Period Weeks    Status Achieved    Target Date 10/19/21      PT LONG TERM GOAL #4   Title Pt will increase Left UE strength in shoulder abduction/flexion to 4/5 in order to demonstrate improvement in strength and function for  lifting and carrying.    Baseline 07/27/2021= 2-/5 left shoulder flex/abd, 2/22: LUE: 4+ flexion, 3+ abduction, 4/5 IR;    Time 12    Period Weeks    Status Revised    Target Date 10/19/21      PT LONG TERM GOAL #5   Title Patient will exhibits full rotation ROM in thoracolumbar spine without pain after 10+ repetitions to demonstrate improvement in tolerance with functional/recreational activities such as return to golf and playing with son.    Time 12    Period Weeks    Status New    Target Date 10/19/21                   Plan - 09/24/21 1135     Clinical Impression Statement Pt presents with excellent motivation to participate in session. He responds well to majority of interventions without pain. Pt only with slight discomfort with standing hip abduction (continues to be most challenging intervention for pt). Pt able to progress to more advanced strengthening interventions: deadlift (modified, increased height), leg press, mini squats, and ambulatin on treadmill with incline to level 5. Pt reports no pain with these intervenitons. Pt would like to prepare to return to golf and hiking. The pt would benefit from further skilled PT to continue to improve pain, strength, and mobility to retrun PLOF.    Examination-Activity Limitations Bend;Carry;Lift;Stand;Squat;Reach Overhead    Examination-Participation Restrictions Cleaning;Community Activity;Yard Work    [employer]chant [employer] officer Evolving/Moderate complexity    Rehab Potential Good    PT Frequency 2x / week    PT Duration 12 weeks    PT  Treatment/Interventions ADLs/Self Care Home Management;Cryotherapy;Electrical Stimulation;Moist Heat;Traction;Stair training;Gait training;Functional mobility training;Therapeutic activities;Therapeutic exercise;Balance training;Neuromuscular re-education;Manual techniques;Passive range of motion;Patient/family education;Dry needling;Spinal Manipulations;Joint Manipulations    PT Next Visit Plan advance HEP, strengthening, endurance, mobility    PT Home Exercise Plan Added prone shoulder flexion prone horizontal abduction and serratus anterior punches followed by extension and flexion of the shoulder.  08/25/2021=Access Code: [email or web address]  - Shoulder ER and ABD (resistive with TB); 2/20: Access Code: [email or web address]    Consulted and Agree with Plan of Care Patient             Patient will benefit from skilled therapeutic intervention in order to improve the following deficits and impairments:  Decreased activity tolerance, Decreased endurance, Decreased strength, Decreased range of motion, Hypomobility, Impaired UE functional use, Pain  Visit Diagnosis: Chronic bilateral low back pain without sciatica  Muscle weakness (generalized)     Problem List Patient Active Problem List   Diagnosis Date Noted   Shoulder pain, left 06/02/2021   Poison ivy dermatitis 04/01/2021   Family history of thyroid cancer 09/07/2020   Family history of prostate cancer in father 09/07/2020   Chronic midline low back pain without sciatica 07/07/2020   Acute knee pain 07/07/2020   Hyperlipidemia 09/05/2019   Other allergic rhinitis 06/03/2019   Coughing/wheezing 06/03/2019   Preventative health care 12/26/2018    Zollie Pee, PT 09/24/2021, 11:40 AM  [place] MAIN [facility] SERVICES [address] [place], [place], [address] Phone: [phone]   Fax:  [phone]  Name: Jesus Carroll MRN: [id number] Date of Birth: 02/21/1981

## 2021-09-24 ENCOUNTER — Ambulatory Visit: Payer: 59 | Admitting: Physical Therapy

## 2021-09-28 ENCOUNTER — Ambulatory Visit: Payer: 59

## 2021-09-30 ENCOUNTER — Ambulatory Visit: Payer: 59 | Admitting: Physical Therapy

## 2021-09-30 ENCOUNTER — Encounter: Payer: Self-pay | Admitting: Physical Therapy

## 2021-09-30 ENCOUNTER — Other Ambulatory Visit: Payer: Self-pay

## 2021-09-30 DIAGNOSIS — M25619 Stiffness of unspecified shoulder, not elsewhere classified: Secondary | ICD-10-CM

## 2021-09-30 DIAGNOSIS — M6281 Muscle weakness (generalized): Secondary | ICD-10-CM | POA: Diagnosis not present

## 2021-09-30 DIAGNOSIS — M25512 Pain in left shoulder: Secondary | ICD-10-CM | POA: Diagnosis not present

## 2021-09-30 DIAGNOSIS — G8929 Other chronic pain: Secondary | ICD-10-CM | POA: Diagnosis not present

## 2021-09-30 DIAGNOSIS — M545 Low back pain, unspecified: Secondary | ICD-10-CM | POA: Diagnosis not present

## 2021-09-30 NOTE — Therapy (Signed)
[place] MAIN [facility] SERVICES [address] [place], [place], [address] Phone: [phone]   Fax:  [phone]  Physical Therapy Treatment  Patient Details  Name: BEN HABERMANN MRN: [id number] Date of Birth: [birth year] Referring Provider (PT): Alma Friendly, NP   Encounter Date: 09/30/2021   PT End of Session - 09/30/21 0941     Visit Number 12    Number of Visits 25    Date for PT Re-Evaluation 10/19/21    Authorization Type Initial Certification 07/26/8784- 10/19/2021    Progress Note Due on Visit 10    PT Start Time 0933    PT Stop Time 1015    PT Time Calculation (min) 42 min    Activity Tolerance Patient tolerated treatment well;No increased pain    Behavior During Therapy [facility] for tasks assessed/performed             Past Medical History:  Diagnosis Date   Allergic conjunctivitis 06/03/2019   Coughing/wheezing 06/03/2019    Past Surgical History:  Procedure Laterality Date   MICRODISCECTOMY LUMBAR  2006    There were no vitals filed for this visit.   Subjective Assessment - 09/30/21 0938     Subjective Pt reports doing well. No pain currently. Reports some soreness in knees after last session, "I do have some jacked up knees so doing squats can be tough." Reports being able to lift dog kennel with no difficulty.    Pertinent History PER MD NOTE FROM 06/02/2021 submitted by Alma Friendly, NP- BROOKLYN JEFF is a 41 year old male with a past medical history of chronic midline low back pain without sciatica presents for acute shoulder pain and continued lower back pain.   He was last seen for his back pain on 02/18/21 for an acute flare. Plain films of the lumbar spine were obtained which were unremarkable and he trialed cyclobenzaprine at night as well as Ibuprofen as needed to help with muscle relaxation.      Back pain: Pt had discectomy in 2006 and hs had chronic lower back pain since. In the past year, the pain is becoming  more problematic. No radiculopathy. He has been taking the cyclobenzaprine, and he reports as being helpful. Activities with bending and/or heavy lifting aggravate the pain. He also takes 800 mg of Ibuprofen approximately 3 times per week, which he also finds helpful. He also uses [employer] which is helpful.     Shoulder pain: Left shoulder pain onset one month ago at the proximal bicep muscle. He describes it as in intermittent, stabbing pain with activities that require external rotation. He rates the pain as 8/10. He did not have any precipitating traumatic event.    Limitations Sitting;Lifting;Standing;House hold activities;Walking    How long can you sit comfortably? 1 hour    How long can you stand comfortably? 1 hour    How long can you walk comfortably? 40 min    Diagnostic tests 02/2021-IMPRESSION:  No acute osseous abnormality.     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    Patient Stated Goals Full ROM for my shoulder as painfree as possible and improve my stiffness and pain in my back    Currently in Pain? No/denies    Pain Onset More than a month ago                     TREATMENT: Hooklying positioning; Lumbar trunk rotation with legs apart (windshield  wiper) x10 reps Single knee to chest stretch 15 sec hold x1 reps             Progressed to hip circles AROM x5 reps each direction Foot crossed over opposite LE hip adduction/flexion x5 reps AROM for piriformis/IT band stretch;  Sidelying: Trunk rotation bow and arrow x5 reps with elbow bent, progressed to 5 reps with arm straight for better thoracic rotation, no pain reported;  Clamshells green tband 2x10 reps, reports moderate difficulty, less difficulty on LLE but still challenging; Does require therapist min A to avoid trunk rotation;    Qped: Cat/camel stretch 5 sec hold x5 reps with cues for increased ROM and breath support Thoracic trunk rotation reaching under UE and rotating into open  position x5 reps each UE;   Standing on red tband: -D1/D2 flexion with each UE x10 reps each with min VCS for proper positioning;  [place] at cable machine: -BUE low row 12.5# x15 -BUE lat pulldown 12.5# x15 Min Vcs to avoid shoulder elevation and increase scapular retraction  Sitting piriformis stretch 20 sec hold x2 reps each LE Sitting pball roll outs 5 sec hold x5 reps with cues to increase shoulder flexion for better shoulder stretch;   Education provided throughout session via VC/TC and demonstration to facilitate movement at target joints and correct muscle activation for all testing and exercises performed.                  PT Education - 09/30/21 0941     Education Details exercise technique/positioning, HEP    Person(s) Educated Patient    Methods Explanation;Verbal cues    Comprehension Verbalized understanding;Verbal cues required;Returned demonstration;Need further instruction              PT Short Term Goals - 09/15/21 0945       PT SHORT TERM GOAL #1   Title Pt will be independent with HEP in order to improve strength and decrease pain in order to improve pain-free function at home and work.    Baseline 07/27/2021- Patient presents with no formal HEP in place., 2/22: doing 2x a day    Time 6    Period Weeks    Status Achieved    Target Date 09/07/21               PT Long Term Goals - 09/15/21 0945       PT LONG TERM GOAL #1   Title Pt will improve FOTO to target score of 64 to display perceived improvements in ability to complete ADL's.    Baseline 07/27/2021=53, 2/22: 54%    Time 12    Period Weeks    Status Partially Met    Target Date 10/19/21      PT LONG TERM GOAL #2   Title Pt will decrease quick DASH score by at least 8% in order to demonstrate clinically significant reduction in disability.    Baseline 07/27/2021= 29.5 %, 2/22: 20.4%    Time 12    Period Weeks    Status Achieved    Target Date 10/19/21      PT LONG TERM  GOAL #3   Title Pt will decrease worst pain as reported on NPRS by at least 3 points in order to demonstrate clinically significant reduction in low back and left shoudler pain.    Baseline 07/27/2021= 7/10 reported LPB and left shoulder pain, 2/22: worst pain 3/10 in low back, 0.5/10 in shoulder    Time 12  Period Weeks    Status Achieved    Target Date 10/19/21      PT LONG TERM GOAL #4   Title Pt will increase Left UE strength in shoulder abduction/flexion to 4/5 in order to demonstrate improvement in strength and function for lifting and carrying.    Baseline 07/27/2021= 2-/5 left shoulder flex/abd, 2/22: LUE: 4+ flexion, 3+ abduction, 4/5 IR;    Time 12    Period Weeks    Status Revised    Target Date 10/19/21      PT LONG TERM GOAL #5   Title Patient will exhibits full rotation ROM in thoracolumbar spine without pain after 10+ repetitions to demonstrate improvement in tolerance with functional/recreational activities such as return to golf and playing with son.    Time 12    Period Weeks    Status New    Target Date 10/19/21                   Plan - 09/30/21 1014     Clinical Impression Statement Patient motivated and participated well within session. he was instructed in advanced ROM/rotation stretches to facilitate better spinal mobility. He denies any back pain with advanced stretches. Patient also instructed in advanced resistance for better UE/LE movement. He requires min VCs for proper exercise technique. Patient tolerated well. he reports fatigue at end of session but denies any increase in pain. He would benefit from additional skilled PT Intervention to improve UE strength for lifting and spinal mobility for higher level tasks such as return to golf/fishing.    Examination-Activity Limitations Bend;Carry;Lift;Stand;Squat;Reach Overhead    Examination-Participation Restrictions Cleaning;Community Activity;Yard Work    [employer]chant [employer] officer  Evolving/Moderate complexity    Rehab Potential Good    PT Frequency 2x / week    PT Duration 12 weeks    PT Treatment/Interventions ADLs/Self Care Home Management;Cryotherapy;Electrical Stimulation;Moist Heat;Traction;Stair training;Gait training;Functional mobility training;Therapeutic activities;Therapeutic exercise;Balance training;Neuromuscular re-education;Manual techniques;Passive range of motion;Patient/family education;Dry needling;Spinal Manipulations;Joint Manipulations    PT Next Visit Plan advance HEP, strengthening, endurance, mobility    PT Home Exercise Plan Added prone shoulder flexion prone horizontal abduction and serratus anterior punches followed by extension and flexion of the shoulder.  08/25/2021=Access Code: [email or web address]  - Shoulder ER and ABD (resistive with TB); 2/20: Access Code: [email or web address]    Consulted and Agree with Plan of Care Patient             Patient will benefit from skilled therapeutic intervention in order to improve the following deficits and impairments:  Decreased activity tolerance, Decreased endurance, Decreased strength, Decreased range of motion, Hypomobility, Impaired UE functional use, Pain  Visit Diagnosis: Chronic bilateral low back pain without sciatica  Muscle weakness (generalized)  Chronic left shoulder pain  Limited range of motion of shoulder  Limited range of motion (ROM) of shoulder  Chronic left-sided low back pain without sciatica     Problem List Patient Active Problem List   Diagnosis Date Noted   Shoulder pain, left 06/02/2021   Poison ivy dermatitis 04/01/2021   Family history of thyroid cancer 09/07/2020   Family history of prostate cancer in father 09/07/2020   Chronic midline low back pain without sciatica 07/07/2020   Acute knee pain 07/07/2020   Hyperlipidemia 09/05/2019   Other allergic rhinitis 06/03/2019   Coughing/wheezing 06/03/2019   Preventative health care 12/26/2018    Garyn Arlotta, PT,  DPT 09/30/2021, 10:16 AM  [place]  [place], [place], [address] Phone: [phone]   Fax:  [phone]  Name: ANDRAY ASSEFA MRN: [id number] Date of Birth: January 10, 1981

## 2021-09-30 NOTE — Patient Instructions (Signed)
Access Code: [email or web address] ?URL: [email or web address] ?Date: 09/30/2021 ?Prepared by: Zettie Pho ? ?Exercises ?Sidelying Thoracic Rotation with Open Book - 1 x daily - 7 x weekly - 1 sets - 5 reps ?Cat Cow - 1 x daily - 7 x weekly - 1 sets - 5 reps ?Quadruped Full Range Thoracic Rotation with Reach - 1 x daily - 7 x weekly - 1 sets - 5 reps ?Standing Single Arm Shoulder PNF D1 Flexion with Anchored Resistance - 1 x daily - 4 x weekly - 2 sets - 10 reps ?Standing Shoulder Single Arm PNF D2 Flexion with Anchored Resistance - 1 x daily - 4 x weekly - 2 sets - 10 reps ? ?

## 2021-10-04 ENCOUNTER — Ambulatory Visit: Payer: 59 | Admitting: Physical Therapy

## 2021-10-06 ENCOUNTER — Ambulatory Visit: Payer: 59 | Admitting: Physical Therapy

## 2021-10-06 ENCOUNTER — Other Ambulatory Visit: Payer: Self-pay

## 2021-10-06 DIAGNOSIS — M25619 Stiffness of unspecified shoulder, not elsewhere classified: Secondary | ICD-10-CM | POA: Diagnosis not present

## 2021-10-06 DIAGNOSIS — M545 Low back pain, unspecified: Secondary | ICD-10-CM | POA: Diagnosis not present

## 2021-10-06 DIAGNOSIS — M6281 Muscle weakness (generalized): Secondary | ICD-10-CM | POA: Diagnosis not present

## 2021-10-06 DIAGNOSIS — G8929 Other chronic pain: Secondary | ICD-10-CM

## 2021-10-06 DIAGNOSIS — M25512 Pain in left shoulder: Secondary | ICD-10-CM | POA: Diagnosis not present

## 2021-10-06 NOTE — Therapy (Signed)
[facility] ?[place], [place], [address] ?Phone: [phone]   Fax:  [phone] ? ?Physical Therapy Treatment ? ?Patient Details  ?Name: Jesus Carroll ?MRN: [id number] ?Date of Birth: [birth year] ?Referring Provider (PT): Alma Friendly, NP ? ? ?Encounter Date: 10/06/2021 ? ? PT End of Session - 10/06/21 2774   ? ? Visit Number 13   ? Number of Visits 25   ? Date for PT Re-Evaluation 10/19/21   ? Authorization Type Initial Certification 07/26/8784- 10/19/2021   ? Progress Note Due on Visit 20   ? PT Start Time [phone]   ? PT Stop Time 1015   ? PT Time Calculation (min) 42 min   ? Activity Tolerance Patient tolerated treatment well;No increased pain   ? Behavior During Therapy [facility] for tasks assessed/performed   ? ?  ?  ? ?  ? ? ?Past Medical History:  ?Diagnosis Date  ? Allergic conjunctivitis 06/03/2019  ? Coughing/wheezing 06/03/2019  ? ? ?Past Surgical History:  ?Procedure Laterality Date  ? MICRODISCECTOMY LUMBAR  2006  ? ? ?There were no vitals filed for this visit. ? ? Subjective Assessment - 10/06/21 0934   ? ? Subjective Pt reports doing well.   ? Pertinent History PER MD NOTE FROM 06/02/2021 submitted by Alma Friendly, NP- JULIEN OSCAR is a 41 year old male with a past medical history of chronic midline low back pain without sciatica presents for acute shoulder pain and continued lower back pain.   He was last seen for his back pain on 02/18/21 for an acute flare. Plain films of the lumbar spine were obtained which were unremarkable and he trialed cyclobenzaprine at night as well as Ibuprofen as needed to help with muscle relaxation.      Back pain: Pt had discectomy in 2006 and hs had chronic lower back pain since. In the past year, the pain is becoming more problematic. No radiculopathy. He has been taking the cyclobenzaprine, and he reports as being helpful. Activities with bending and/or heavy lifting aggravate the pain. He also takes  800 mg of Ibuprofen approximately 3 times per week, which he also finds helpful. He also uses [employer] which is helpful.     Shoulder pain: Left shoulder pain onset one month ago at the proximal bicep muscle. He describes it as in intermittent, stabbing pain with activities that require external rotation. He rates the pain as 8/10. He did not have any precipitating traumatic event.   ? Limitations Sitting;Lifting;Standing;House hold activities;Walking   ? How long can you sit comfortably? 1 hour   ? How long can you stand comfortably? 1 hour   ? How long can you walk comfortably? 40 min   ? Diagnostic tests 02/2021-IMPRESSION:  No acute osseous abnormality.     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   ? Patient Stated Goals Full ROM for my shoulder as painfree as possible and improve my stiffness and pain in my back   ? Currently in Pain? Yes   ? Pain Score 3    ? Pain Location Back   ? Pain Orientation Lower   ? Pain Descriptors / Indicators Sore   ? Pain Type Chronic pain   ? Pain Onset More than a month ago   ? Pain Frequency Intermittent   ? Aggravating Factors  prolonged sitting, lifting   ? ?  ?  ? ?  ? ?TREATMENT: ?Seated ball roll  outs 5 x 5 sec hold each direction.  ?Hooklying positioning; ?Lumbar trunk rotation with legs apart (windshield wiper) x10 reps ?Single knee to chest stretch 15 sec hold x1 reps ?            Progressed to hip circles AROM x5 reps each direction ?Supine piriformis stretch 2 x 30 sec on ea side  ? ? ? ?Golf swing practice and core stabilization exercises  ?Matrix cable at top level and golf attachment with 7.5# ?-second 1/3 of golf swing ( end of backswing to ground contact with club)  ?Moved matrix system of top with 2.5# ?Ground contact to follow through 2 x 10  ?Backswing with matrix at lowest level and 2.5# ?2 x 10  ?Paloff press 7.5 # x 10 ea  ?Cable pull across with cable system at shoulder height and pulling from one side to another 10 x ea side @ 2.5#  resistance  ?-cues for slow and controlled movements throughout, particularly with eccentric conteol  ? ?Treadmill interval hill training for hiking progression @ 2.8 MPH  ?1 min warm up, 2 min at level 5 incline, 1 min rest walking, 2 min level 6 incline, 1 min level walking, 1 min level 7 incline ?- pt reports mod to hard difficulty  ? ? ?Education provided throughout session via VC/TC and demonstration to facilitate movement at target joints and correct muscle activation for all testing and exercises performed.  ?  ?Following treatment pt reports no pain in his low back  ? ? ? ? ? ? ? ? ? ? ? ? ? ? ? ? ? ? ? ? ? ? ? ? ? PT Education - 10/06/21 0938   ? ? Education Details exercise technique/ positioing   ? Person(s) Educated Patient   ? Methods Explanation   ? Comprehension Verbalized understanding;Verbal cues required;Returned demonstration   ? ?  ?  ? ?  ? ? ? PT Short Term Goals - 09/15/21 0945   ? ?  ? PT SHORT TERM GOAL #1  ? Title Pt will be independent with HEP in order to improve strength and decrease pain in order to improve pain-free function at home and work.   ? Baseline 07/27/2021- Patient presents with no formal HEP in place., 2/22: doing 2x a day   ? Time 6   ? Period Weeks   ? Status Achieved   ? Target Date 09/07/21   ? ?  ?  ? ?  ? ? ? ? PT Long Term Goals - 09/15/21 0945   ? ?  ? PT LONG TERM GOAL #1  ? Title Pt will improve FOTO to target score of 64 to display perceived improvements in ability to complete ADL's.   ? Baseline 07/27/2021=53, 2/22: 54%   ? Time 12   ? Period Weeks   ? Status Partially Met   ? Target Date 10/19/21   ?  ? PT LONG TERM GOAL #2  ? Title Pt will decrease quick DASH score by at least 8% in order to demonstrate clinically significant reduction in disability.   ? Baseline 07/27/2021= 29.5 %, 2/22: 20.4%   ? Time 12   ? Period Weeks   ? Status Achieved   ? Target Date 10/19/21   ?  ? PT LONG TERM GOAL #3  ? Title Pt will decrease worst pain as reported on NPRS by at least 3  points in order to demonstrate clinically significant reduction in low back and left  shoudler pain.   ? Baseline 07/27/2021= 7/10 reported LPB and left shoulder pain, 2/22: worst pain 3/10 in low back, 0.5/10 in shoulder   ? Time 12   ? Period Weeks   ? Status Achieved   ? Target Date 10/19/21   ?  ? PT LONG TERM GOAL #4  ? Title Pt will increase Left UE strength in shoulder abduction/flexion to 4/5 in order to demonstrate improvement in strength and function for lifting and carrying.   ? Baseline 07/27/2021= 2-/5 left shoulder flex/abd, 2/22: LUE: 4+ flexion, 3+ abduction, 4/5 IR;   ? Time 12   ? Period Weeks   ? Status Revised   ? Target Date 10/19/21   ?  ? PT LONG TERM GOAL #5  ? Title Patient will exhibits full rotation ROM in thoracolumbar spine without pain after 10+ repetitions to demonstrate improvement in tolerance with functional/recreational activities such as return to golf and playing with son.   ? Time 12   ? Period Weeks   ? Status New   ? Target Date 10/19/21   ? ?  ?  ? ?  ? ? ? ? ? ? ? ? Plan - 10/06/21 0938   ? ? Clinical Impression Statement Pt presents with excellent motivatino for copmletion of PT program. Pt began with lower back stretching and mobility exercises prior to performing several golfing low back stabalzation exercises. Pt tolerated well with no increase in pain but had some soreness. Also progressed with hiking and walking progression and pt tolerated well with no increase in pain. Pt will continue to benefit from skilled PT to imporve his participation restrictions, pain, and overall activity tolerance.   ? Examination-Activity Limitations Bend;Carry;Lift;Stand;Squat;Reach Overhead   ? Examination-Participation Restrictions Cleaning;Community Activity;[employer] Work   ? Stability/Clinical Decision Making Evolving/Moderate complexity   ? Rehab Potential Good   ? PT Frequency 2x / week   ? PT Duration 12 weeks   ? PT Treatment/Interventions ADLs/Self Care Home  Management;Cryotherapy;Electrical Stimulation;Moist Heat;Traction;Stair training;Gait training;Functional mobility training;Therapeutic activities;Therapeutic exercise;Balance training;Neuromuscular re-education;Manual techniques;Pa

## 2021-10-12 ENCOUNTER — Other Ambulatory Visit: Payer: Self-pay

## 2021-10-12 ENCOUNTER — Ambulatory Visit: Payer: 59

## 2021-10-12 DIAGNOSIS — M25619 Stiffness of unspecified shoulder, not elsewhere classified: Secondary | ICD-10-CM | POA: Diagnosis not present

## 2021-10-12 DIAGNOSIS — M545 Low back pain, unspecified: Secondary | ICD-10-CM | POA: Diagnosis not present

## 2021-10-12 DIAGNOSIS — G8929 Other chronic pain: Secondary | ICD-10-CM | POA: Diagnosis not present

## 2021-10-12 DIAGNOSIS — M6281 Muscle weakness (generalized): Secondary | ICD-10-CM | POA: Diagnosis not present

## 2021-10-12 DIAGNOSIS — M25512 Pain in left shoulder: Secondary | ICD-10-CM | POA: Diagnosis not present

## 2021-10-12 NOTE — Therapy (Signed)
[name] ?[place], [place], [address] ?Phone: [phone]   Fax:  [phone] ? ?Physical Therapy Treatment ? ?Patient Details  ?Name: Jesus Carroll ?MRN: [id number] ?Date of Birth: 05-02-1981 ?Referring Provider (PT): Alma Friendly, NP ? ? ?Encounter Date: 10/12/2021 ? ? PT End of Session - 10/12/21 1144   ? ? Visit Number 14   ? Number of Visits 25   ? Date for PT Re-Evaluation 10/19/21   ? Authorization Type Initial Certification 10/24/6382- 10/19/2021   ? Progress Note Due on Visit 20   ? PT Start Time 1432   ? PT Stop Time 5364   ? PT Time Calculation (min) 42 min   ? Activity Tolerance Patient tolerated treatment well;No increased pain   ? Behavior During Therapy [facility] for tasks assessed/performed   ? ?  ?  ? ?  ? ? ?Past Medical History:  ?Diagnosis Date  ? Allergic conjunctivitis 06/03/2019  ? Coughing/wheezing 06/03/2019  ? ? ?Past Surgical History:  ?Procedure Laterality Date  ? MICRODISCECTOMY LUMBAR  2006  ? ? ?There were no vitals filed for this visit. ? ? Subjective Assessment - 10/12/21 1253   ? ? Subjective Pt reports he was experiencing some increased low back pain that he reports wasn't typical muscle soreness but today states feeling much better and denies any current LBP.   ? Pertinent History PER MD NOTE FROM 06/02/2021 submitted by Alma Friendly, NP- Jesus Carroll is a 41 year old male with a past medical history of chronic midline low back pain without sciatica presents for acute shoulder pain and continued lower back pain.   He was last seen for his back pain on 02/18/21 for an acute flare. Plain films of the lumbar spine were obtained which were unremarkable and he trialed cyclobenzaprine at night as well as Ibuprofen as needed to help with muscle relaxation.      Back pain: Pt had discectomy in 2006 and hs had chronic lower back pain since. In the past year, the pain is becoming more problematic. No radiculopathy. He  has been taking the cyclobenzaprine, and he reports as being helpful. Activities with bending and/or heavy lifting aggravate the pain. He also takes 800 mg of Ibuprofen approximately 3 times per week, which he also finds helpful. He also uses [employer] which is helpful.     Shoulder pain: Left shoulder pain onset one month ago at the proximal bicep muscle. He describes it as in intermittent, stabbing pain with activities that require external rotation. He rates the pain as 8/10. He did not have any precipitating traumatic event.   ? Limitations Sitting;Lifting;Standing;House hold activities;Walking   ? How long can you sit comfortably? 1 hour   ? How long can you stand comfortably? 1 hour   ? How long can you walk comfortably? 40 min   ? Diagnostic tests 02/2021-IMPRESSION:  No acute osseous abnormality.     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   ? Patient Stated Goals Full ROM for my shoulder as painfree as possible and improve my stiffness and pain in my back   ? Currently in Pain? No/denies   ? Pain Onset More than a month ago   ? ?  ?  ? ?  ? ?INTERVENTIONS:  ? ?Manual therapy: Supine on mat table ?Double knee to chest- Hold 30sec x 4 ?Lower trunk rotation:  manual 5 x each direction- hold 20 sec ?  Supine Piriformis stretch - hold 20 sec x 4 each side. Increased tightness noted on right side.  ?Hamstring stretch, 3 x 30 seconds each LE  ? ? ?Therex:  ?Bridge (reviewed with VC and visual Demo)  x 10 reps- Emphasize contracting core initially then raise bottom off mat.  ? ?Bridge with alt LE knee ext (in bridge position) x 12 reps each.  ? ?Bridge with Hip add squeeze using red soft ball x 12 reps- 3 sec hold.  ? ?Bridge with hamstring curl on green theraball x 12 reps. Patient reports "Well I can really feel that one but denies any pain." ? ?Wall slides with ball at lumbar section for support x 12 reps (VC to lower down into comfortable squat feeling some tension in both quads. Patient  denied any knee issues or back pain during activity.  ? ?Education provided throughout session via VC/TC and demonstration to facilitate movement at target joints and correct muscle activation for all testing and exercises performed.  ? ?Access Code: [email or web address] ?URL: [email or web address] ?Date: 10/12/2021 ?Prepared by: Sande Brothers ? ?Exercises ?Alternating Single Leg Bridge - 1 x daily - 3 x weekly - 3 sets - 10 reps ?Supine Bridge with Mini Swiss Ball Between Knees - 1 x daily - 3 x weekly - 3 sets - 10 reps ? ?. ? ? ? ? ? ? ? ? ? ? ? ? ? ? ? ? ? ? ? ? PT Education - 10/13/21 1255   ? ? Education Details Core exercise technique   ? Person(s) Educated Patient   ? Methods Explanation;Demonstration;Tactile cues;Verbal cues   ? Comprehension Returned demonstration;Tactile cues required;Verbalized understanding;Verbal cues required;Need further instruction   ? ?  ?  ? ?  ? ? ? PT Short Term Goals - 09/15/21 0945   ? ?  ? PT SHORT TERM GOAL #1  ? Title Pt will be independent with HEP in order to improve strength and decrease pain in order to improve pain-free function at home and work.   ? Baseline 07/27/2021- Patient presents with no formal HEP in place., 2/22: doing 2x a day   ? Time 6   ? Period Weeks   ? Status Achieved   ? Target Date 09/07/21   ? ?  ?  ? ?  ? ? ? ? PT Long Term Goals - 09/15/21 0945   ? ?  ? PT LONG TERM GOAL #1  ? Title Pt will improve FOTO to target score of 64 to display perceived improvements in ability to complete ADL's.   ? Baseline 07/27/2021=53, 2/22: 54%   ? Time 12   ? Period Weeks   ? Status Partially Met   ? Target Date 10/19/21   ?  ? PT LONG TERM GOAL #2  ? Title Pt will decrease quick DASH score by at least 8% in order to demonstrate clinically significant reduction in disability.   ? Baseline 07/27/2021= 29.5 %, 2/22: 20.4%   ? Time 12   ? Period Weeks   ? Status Achieved   ? Target Date 10/19/21   ?  ? PT LONG TERM GOAL #3  ? Title Pt will decrease worst pain as  reported on NPRS by at least 3 points in order to demonstrate clinically significant reduction in low back and left shoudler pain.   ? Baseline 07/27/2021= 7/10 reported LPB and left shoulder pain, 2/22: worst pain 3/10 in low back, 0.5/10 in shoulder   ? Time 12   ?  Period Weeks   ? Status Achieved   ? Target Date 10/19/21   ?  ? PT LONG TERM GOAL #4  ? Title Pt will increase Left UE strength in shoulder abduction/flexion to 4/5 in order to demonstrate improvement in strength and function for lifting and carrying.   ? Baseline 07/27/2021= 2-/5 left shoulder flex/abd, 2/22: LUE: 4+ flexion, 3+ abduction, 4/5 IR;   ? Time 12   ? Period Weeks   ? Status Revised   ? Target Date 10/19/21   ?  ? PT LONG TERM GOAL #5  ? Title Patient will exhibits full rotation ROM in thoracolumbar spine without pain after 10+ repetitions to demonstrate improvement in tolerance with functional/recreational activities such as return to golf and playing with son.   ? Time 12   ? Period Weeks   ? Status New   ? Target Date 10/19/21   ? ?  ?  ? ?  ? ? ? ? ? ? ? ? Plan - 10/12/21 1142   ? ? Clinical Impression Statement Patient presents today with excellent motivation. He was able to progress his core stabilization exercises without significant difficulty and no reported increased LBP today. He required VC and visual demo for correct technique but responds really well and adapts quickly - able to return demonstration while denying any pain. The pt will benefit from further skilled PT to continue to reduce pain, and to increase strength and mobility in order to return to PLOF and increase QOL   ? Examination-Activity Limitations Bend;Carry;Lift;Stand;Squat;Reach Overhead   ? Examination-Participation Restrictions Cleaning;Community Activity;[employer] Work   ? Stability/Clinical Decision Making Evolving/Moderate complexity   ? Rehab Potential Good   ? PT Frequency 2x / week   ? PT Duration 12 weeks   ? PT Treatment/Interventions ADLs/Self Care Home  Management;Cryotherapy;Electrical Stimulation;Moist Heat;Traction;Stair training;Gait training;Functional mobility training;Therapeutic activities;Therapeutic exercise;Balance training;Neuromuscular re-education;M

## 2021-10-14 ENCOUNTER — Ambulatory Visit: Payer: 59

## 2021-10-18 ENCOUNTER — Other Ambulatory Visit: Payer: Self-pay

## 2021-10-18 ENCOUNTER — Encounter: Payer: Self-pay | Admitting: Physical Therapy

## 2021-10-18 ENCOUNTER — Ambulatory Visit: Payer: 59 | Admitting: Physical Therapy

## 2021-10-18 DIAGNOSIS — M25512 Pain in left shoulder: Secondary | ICD-10-CM | POA: Diagnosis not present

## 2021-10-18 DIAGNOSIS — M6281 Muscle weakness (generalized): Secondary | ICD-10-CM

## 2021-10-18 DIAGNOSIS — M25619 Stiffness of unspecified shoulder, not elsewhere classified: Secondary | ICD-10-CM | POA: Diagnosis not present

## 2021-10-18 DIAGNOSIS — M545 Low back pain, unspecified: Secondary | ICD-10-CM | POA: Diagnosis not present

## 2021-10-18 DIAGNOSIS — G8929 Other chronic pain: Secondary | ICD-10-CM | POA: Diagnosis not present

## 2021-10-18 NOTE — Therapy (Signed)
[facility] ?[place], [place], [address] ?Phone: [phone]   Fax:  [phone] ? ?Physical Therapy Treatment ? ?Patient Details  ?Name: Jesus Carroll ?MRN: [id number] ?Date of Birth: 1981/07/10 ?Referring Provider (PT): Alma Friendly, NP ? ? ?Encounter Date: 10/18/2021 ? ? PT End of Session - 10/18/21 0943   ? ? Visit Number 15   ? Number of Visits 25   ? Date for PT Re-Evaluation 10/19/21   ? Authorization Type Initial Certification 12/31/6293- 10/19/2021   ? Progress Note Due on Visit 20   ? PT Start Time [id number]   ? PT Stop Time 1015   ? PT Time Calculation (min) 42 min   ? Activity Tolerance Patient tolerated treatment well;No increased pain   ? Behavior During Therapy [facility] for tasks assessed/performed   ? ?  ?  ? ?  ? ? ?Past Medical History:  ?Diagnosis Date  ? Allergic conjunctivitis 06/03/2019  ? Coughing/wheezing 06/03/2019  ? ? ?Past Surgical History:  ?Procedure Laterality Date  ? MICRODISCECTOMY LUMBAR  2006  ? ? ?There were no vitals filed for this visit. ? ? Subjective Assessment - 10/18/21 0935   ? ? Subjective Pt reports he is experiencing soreness in his legs from a strenuous hike. No back pain at this time   ? Pertinent History PER MD NOTE FROM 06/02/2021 submitted by Alma Friendly, NP- Jesus Carroll is a 41 year old male with a past medical history of chronic midline low back pain without sciatica presents for acute shoulder pain and continued lower back pain.   He was last seen for his back pain on 02/18/21 for an acute flare. Plain films of the lumbar spine were obtained which were unremarkable and he trialed cyclobenzaprine at night as well as Ibuprofen as needed to help with muscle relaxation.      Back pain: Pt had discectomy in 2006 and hs had chronic lower back pain since. In the past year, the pain is becoming more problematic. No radiculopathy. He has been taking the cyclobenzaprine, and he reports as being helpful.  Activities with bending and/or heavy lifting aggravate the pain. He also takes 800 mg of Ibuprofen approximately 3 times per week, which he also finds helpful. He also uses [employer] which is helpful.     Shoulder pain: Left shoulder pain onset one month ago at the proximal bicep muscle. He describes it as in intermittent, stabbing pain with activities that require external rotation. He rates the pain as 8/10. He did not have any precipitating traumatic event.   ? Limitations Sitting;Lifting;Standing;House hold activities;Walking   ? How long can you sit comfortably? 1 hour   ? How long can you stand comfortably? 1 hour   ? How long can you walk comfortably? 40 min   ? Diagnostic tests 02/2021-IMPRESSION:  No acute osseous abnormality.     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   ? Patient Stated Goals Full ROM for my shoulder as painfree as possible and improve my stiffness and pain in my back   ? Currently in Pain? Yes   ? Pain Location Leg   ? Pain Descriptors / Indicators Sore   ? Pain Onset In the past 7 days   ? Pain Frequency Constant   ? ?  ?  ? ?  ? ? ?  ? ?TREATMENT: ? ?Manual therapy ? ?Hamstring stretch x45 seconds bilaterally.  Knee-to-chest stretch with  hip circles counterclockwise and clockwise x5 bilaterally ?Soft tissue mobilization to patient's left base of his paraspinal muscles were significant trigger point was noted. ? ?Therapeutic exercise ?Seated ball roll outs 5 x 5 sec hold each direction.  ? ?Hooklying positioning; ? ?Lumbar trunk rotation with legs apart (windshield wiper) x10 reps ? ?Supine piriformis stretch 2 x 30 sec on ea side  ? ?Bridge x 10 reps- Emphasize contracting core initially then raise bottom off mat.  ? ?Bridge with alt LE knee ext (in bridge position) x 12 reps each.  ? ?Bridge with Hip add squeeze using red soft ball x 12 reps- 3 sec hold.  ? ? ?Education provided throughout session via VC/TC and demonstration to facilitate movement at target  joints and correct muscle activation for all testing and exercises performed.  ?  ?Following treatment pt reports no pain in his low back  ? ? ? ? ? ? ? ? ? ? ? ? ? ? ? ? ? ? ? ? ? ? ? ? ? ? ? PT Education - 10/18/21 1203   ? ? Education Details exercise technique, trigger point anatomy   ? Person(s) Educated Patient   ? Methods Explanation   ? Comprehension Verbalized understanding   ? ?  ?  ? ?  ? ? ? PT Short Term Goals - 09/15/21 0945   ? ?  ? PT SHORT TERM GOAL #1  ? Title Pt will be independent with HEP in order to improve strength and decrease pain in order to improve pain-free function at home and work.   ? Baseline 07/27/2021- Patient presents with no formal HEP in place., 2/22: doing 2x a day   ? Time 6   ? Period Weeks   ? Status Achieved   ? Target Date 09/07/21   ? ?  ?  ? ?  ? ? ? ? PT Long Term Goals - 09/15/21 0945   ? ?  ? PT LONG TERM GOAL #1  ? Title Pt will improve FOTO to target score of 64 to display perceived improvements in ability to complete ADL's.   ? Baseline 07/27/2021=53, 2/22: 54%   ? Time 12   ? Period Weeks   ? Status Partially Met   ? Target Date 10/19/21   ?  ? PT LONG TERM GOAL #2  ? Title Pt will decrease quick DASH score by at least 8% in order to demonstrate clinically significant reduction in disability.   ? Baseline 07/27/2021= 29.5 %, 2/22: 20.4%   ? Time 12   ? Period Weeks   ? Status Achieved   ? Target Date 10/19/21   ?  ? PT LONG TERM GOAL #3  ? Title Pt will decrease worst pain as reported on NPRS by at least 3 points in order to demonstrate clinically significant reduction in low back and left shoudler pain.   ? Baseline 07/27/2021= 7/10 reported LPB and left shoulder pain, 2/22: worst pain 3/10 in low back, 0.5/10 in shoulder   ? Time 12   ? Period Weeks   ? Status Achieved   ? Target Date 10/19/21   ?  ? PT LONG TERM GOAL #4  ? Title Pt will increase Left UE strength in shoulder abduction/flexion to 4/5 in order to demonstrate improvement in strength and function for lifting  and carrying.   ? Baseline 07/27/2021= 2-/5 left shoulder flex/abd, 2/22: LUE: 4+ flexion, 3+ abduction, 4/5 IR;   ? Time 12   ?  Period Weeks   ? Status Revised   ? Target Date 10/19/21   ?  ? PT LONG TERM GOAL #5  ? Title Patient will exhibits full rotation ROM in thoracolumbar spine without pain after 10+ repetitions to demonstrate improvement in tolerance with functional/recreational activities such as return to golf and playing with son.   ? Time 12   ? Period Weeks   ? Status New   ? Target Date 10/19/21   ? ?  ?  ? ?  ? ? ? ? ? ? ? ? Plan - 10/18/21 0943   ? ? Clinical Impression Statement Patient presents with excellent motivation for completion of physical therapy session.  Patient reports significant soreness in his bilateral lower extremities following a strenuous hike he performed over the weekend.  Patient does report no back pain at this time.  Patient tenuous several core stability exercises in order to improve his low back pain and posture.  Patient also treated with soft tissue mobilization and manual stretching and had 1 significant trigger point at the base of his paraspinals on the left side.  Patient will benefit from continued monitoring of this potential for trigger point dry needling next session depending on availability of certified dry needling therapists.  Patient will continue to benefit from skilled physical therapy intervention in order to improve his lower extremity strength, endurance, pain, and overall quality of life and function.   ? Examination-Activity Limitations Bend;Carry;Lift;Stand;Squat;Reach Overhead   ? Examination-Participation Restrictions Cleaning;Community Activity;[employer] Work   ? Stability/Clinical Decision Making Evolving/Moderate complexity   ? Rehab Potential Good   ? PT Frequency 2x / week   ? PT Duration 12 weeks   ? PT Treatment/Interventions ADLs/Self Care Home Management;Cryotherapy;Electrical Stimulation;Moist Heat;Traction;Stair training;Gait training;Functional  mobility training;Therapeutic activities;Therapeutic exercise;Balance training;Neuromuscular re-education;Manual techniques;Passive range of motion;Patient/family education;Dry needling;Spinal Manipulation

## 2021-10-20 ENCOUNTER — Ambulatory Visit: Payer: 59 | Admitting: Physical Therapy

## 2021-10-25 ENCOUNTER — Encounter: Payer: Self-pay | Admitting: Physical Therapy

## 2021-10-25 ENCOUNTER — Ambulatory Visit: Payer: 59 | Attending: Primary Care | Admitting: Physical Therapy

## 2021-10-25 DIAGNOSIS — M25512 Pain in left shoulder: Secondary | ICD-10-CM | POA: Insufficient documentation

## 2021-10-25 DIAGNOSIS — M545 Low back pain, unspecified: Secondary | ICD-10-CM | POA: Insufficient documentation

## 2021-10-25 DIAGNOSIS — M6281 Muscle weakness (generalized): Secondary | ICD-10-CM | POA: Insufficient documentation

## 2021-10-25 DIAGNOSIS — M25619 Stiffness of unspecified shoulder, not elsewhere classified: Secondary | ICD-10-CM | POA: Insufficient documentation

## 2021-10-25 DIAGNOSIS — G8929 Other chronic pain: Secondary | ICD-10-CM | POA: Insufficient documentation

## 2021-10-25 NOTE — Therapy (Signed)
[facility] ?[place], [place], [address] ?Phone: [phone]   Fax:  [phone] ? ?Physical Therapy Treatment/ Recert note  ? ?Patient Details  ?Name: Jesus Carroll ?MRN: [id number] ?Date of Birth: June 17, 1981 ?Referring Provider (PT): Alma Friendly, NP ? ? ?Encounter Date: 10/25/2021 ? ? PT End of Session - 10/25/21 0855   ? ? Visit Number 16   ? Number of Visits 25   ? Date for PT Re-Evaluation 10/19/21   ? Authorization Type Initial Certification 0/0/3704- 10/19/2021   ? Progress Note Due on Visit 20   ? PT Start Time 0845   ? PT Stop Time 0930   ? PT Time Calculation (min) 45 min   ? Activity Tolerance Patient tolerated treatment well;No increased pain   ? Behavior During Therapy [facility] for tasks assessed/performed   ? ?  ?  ? ?  ? ? ?Past Medical History:  ?Diagnosis Date  ? Allergic conjunctivitis 06/03/2019  ? Coughing/wheezing 06/03/2019  ? ? ?Past Surgical History:  ?Procedure Laterality Date  ? MICRODISCECTOMY LUMBAR  2006  ? ? ?There were no vitals filed for this visit. ? ? Subjective Assessment - 10/25/21 0849   ? ? Subjective Pt reports his shoulder is feeling a lot better. Pt feels his shoulder is a lot better but still has some pain at times when he is picking up heavy objects. Pt reports when he is trying ot scratch his back both with ER and IR methods of scratching. Pt reports his back still gets sore following periods of activity such as yard work. Pt also reports back pain a ttimes when he is sleeping at times but is position dependent. Pt reports he cannot lie on his left side without his back starting to " nag". Pt reports his legs are in need to get in shape but he feels he can continue to improve his leg strength at home and with hiking.   ? Pertinent History PER MD NOTE FROM 06/02/2021 submitted by Alma Friendly, NP- Jesus Carroll is a 41 year old male with a past medical history of chronic midline low back pain without  sciatica presents for acute shoulder pain and continued lower back pain.   He was last seen for his back pain on 02/18/21 for an acute flare. Plain films of the lumbar spine were obtained which were unremarkable and he trialed cyclobenzaprine at night as well as Ibuprofen as needed to help with muscle relaxation.      Back pain: Pt had discectomy in 2006 and hs had chronic lower back pain since. In the past year, the pain is becoming more problematic. No radiculopathy. He has been taking the cyclobenzaprine, and he reports as being helpful. Activities with bending and/or heavy lifting aggravate the pain. He also takes 800 mg of Ibuprofen approximately 3 times per week, which he also finds helpful. He also uses [employer] which is helpful.     Shoulder pain: Left shoulder pain onset one month ago at the proximal bicep muscle. He describes it as in intermittent, stabbing pain with activities that require external rotation. He rates the pain as 8/10. He did not have any precipitating traumatic event.   ? Limitations Sitting;Lifting;Standing;House hold activities;Walking   ? How long can you sit comfortably? 1 hour   ? How long can you stand comfortably? 1 hour   ? How long can you walk comfortably? 40 min   ? Diagnostic tests 02/2021-IMPRESSION:  No acute osseous abnormality.     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   ? Patient Stated Goals Full ROM for my shoulder as painfree as possible and improve my stiffness and pain in my back   ? Pain Score 3    ? Pain Location Back   ? Pain Orientation Left;Lower   ? Pain Descriptors / Indicators Aching;Sore   ? Pain Type Chronic pain   ? Pain Onset More than a month ago   ? Pain Frequency Occasional   ? Aggravating Factors  lifting, twisting   ? Effect of Pain on Daily Activities decreased activity tolerance   ? ?  ?  ? ?  ? ? ? ?Physical therapy treatment session today consisted of completing assessment of goals and administration of testing as  demonstrated in flow sheet and in goals section of this exam . Addition treatments may be found below.  ? ?Patient instructed in biomechanics of lateral shoulder abduction with heavy weight and was instructed not to attempt to continue these it weights he was receiving previously as this puts excessive strain on the shoulder muscles.  Patient was educated regarding alternative exercises to facilitate shoulder strengthening and elevation. ?Manual: ?STM to L Lumbar paraspinals in area surrounding significant tender to palpation area/ cyst in low back.  Patient reports some soreness upon transition into sitting.   ? ?therex ?Completed ball roll outs 3 directions with 5-second holds for each x5 rounds.  Patient reported to stretch did improve some of the soreness in his low back but still reports 3 of 10 low back pain/soreness at end of session. ? ?Completed 2 sets of 10 of thoracolumbar rotation with holding golf club parallel to chest.  Patient struck to complete this from golfing warm up.  Cues for slow and deliberate movement in order to facilitate improved stretching as well as improve muscle activation patient reports some tightness in his left side of low back toward ends of repetition. ? ? ? ? ? ? ? ? ? ? ? ? ? ? ? ? ? ? ? ? ? ? ? ? ? PT Education - 10/25/21 0854   ? ? Education Details Progress note.   ? Person(s) Educated Patient   ? Methods Explanation   ? Comprehension Verbalized understanding   ? ?  ?  ? ?  ? ? ? PT Short Term Goals - 09/15/21 0945   ? ?  ? PT SHORT TERM GOAL #1  ? Title Pt will be independent with HEP in order to improve strength and decrease pain in order to improve pain-free function at home and work.   ? Baseline 07/27/2021- Patient presents with no formal HEP in place., 2/22: doing 2x a day   ? Time 6   ? Period Weeks   ? Status Achieved   ? Target Date 09/07/21   ? ?  ?  ? ?  ? ? ? ? PT Long Term Goals - 10/25/21 0857   ? ?  ? PT LONG TERM GOAL #1  ? Title Pt will improve FOTO to target  score of 64 to display perceived improvements in ability to complete ADL's.   ? Baseline 07/27/2021=53, 2/22: 54% 10/25/21: 63   ? Time 8   ? Period Weeks   ? Status Partially Met   ? Target Date 12/20/21   ?  ? PT LONG TERM GOAL #2  ? Title Pt will decrease quick DASH score by at least  8% in order to demonstrate clinically significant reduction in disability.   ? Baseline 07/27/2021= 29.5 %, 2/22: 20.4%   ? Time 12   ? Period Weeks   ? Status Achieved   ? Target Date 10/19/21   ?  ? PT LONG TERM GOAL #3  ? Title Pt will decrease worst pain as reported on NPRS by at least 3 points in order to demonstrate clinically significant reduction in low back and left shoudler pain.   ? Baseline 07/27/2021= 7/10 reported LPB and left shoulder pain, 2/22: worst pain 3/10 in low back, 0.5/10 in shoulder, 2/10 LBP ( waking up and being sore) past week, 4/10 following golf workout in PT.   ? Time 12   ? Period Weeks   ? Status Partially Met   ? Target Date 10/19/21   ?  ? PT LONG TERM GOAL #4  ? Title Pt will increase Left UE strength in shoulder abduction/flexion to 4/5 in order to demonstrate improvement in strength and function for lifting and carrying.   ? Baseline 07/27/2021= 2-/5 left shoulder flex/abd, 2/22: LUE: 4+ flexion, 3+ abduction, 4/5 IR; L UE: 4-/5   ? Time 8   ? Period Weeks   ? Status On-going   ? Target Date 12/20/21   ?  ? PT LONG TERM GOAL #5  ? Title Patient will exhibits full rotation ROM in thoracolumbar spine without pain after 10+ repetitions to demonstrate improvement in tolerance with functional/recreational activities such as return to golf and playing with son.   ? Baseline Pt has pain in low back following completing exercises such as this exercises, pt also has pain with perofrming 10 repetitions ( around rep 8) of TL rotation to simulate golf activities   ? Time 8   ? Period Weeks   ? Status New   ? Target Date 12/20/21   ?  ? Additional Long Term Goals  ? Additional Long Term Goals Yes   ?  ? PT LONG TERM  GOAL #6  ? Title Patient will report ability to complete golfing activities or simulating golfing activities without significant pain and discomfort in the day to 2 days following in order to improve his abili

## 2021-11-01 ENCOUNTER — Ambulatory Visit: Payer: 59 | Admitting: Physical Therapy

## 2021-11-01 ENCOUNTER — Other Ambulatory Visit: Payer: Self-pay

## 2021-11-01 DIAGNOSIS — M6281 Muscle weakness (generalized): Secondary | ICD-10-CM | POA: Diagnosis not present

## 2021-11-01 DIAGNOSIS — M25619 Stiffness of unspecified shoulder, not elsewhere classified: Secondary | ICD-10-CM | POA: Diagnosis not present

## 2021-11-01 DIAGNOSIS — M25512 Pain in left shoulder: Secondary | ICD-10-CM | POA: Diagnosis not present

## 2021-11-01 DIAGNOSIS — G8929 Other chronic pain: Secondary | ICD-10-CM | POA: Diagnosis not present

## 2021-11-01 DIAGNOSIS — M545 Low back pain, unspecified: Secondary | ICD-10-CM | POA: Diagnosis not present

## 2021-11-01 MED ORDER — TRIAMCINOLONE ACETONIDE 0.025 % EX CREA
TOPICAL_CREAM | CUTANEOUS | 0 refills | Status: DC
  Filled 2021-11-01: qty 15, 7d supply, fill #0

## 2021-11-01 NOTE — Therapy (Signed)
[facility] ?[place], [place], [address] ?Phone: [phone]   Fax:  [phone] ? ?Physical Therapy Treatment ? ?Patient Details  ?Name: Jesus Carroll ?MRN: [id number] ?Date of Birth: 1980-12-07 ?Referring Provider (PT): Alma Friendly, NP ? ? ?Encounter Date: 11/01/2021 ? ? PT End of Session - 11/01/21 0840   ? ? Visit Number 17   ? Number of Visits 25   ? Date for PT Re-Evaluation 12/20/21   ? Authorization Type Initial Certification 0/12/2374- 10/19/2021   ? Progress Note Due on Visit 20   ? PT Start Time 0845   ? PT Stop Time 0925   ? PT Time Calculation (min) 40 min   ? Activity Tolerance Patient tolerated treatment well;No increased pain   ? Behavior During Therapy [facility] for tasks assessed/performed   ? ?  ?  ? ?  ? ? ?Past Medical History:  ?Diagnosis Date  ? Allergic conjunctivitis 06/03/2019  ? Coughing/wheezing 06/03/2019  ? ? ?Past Surgical History:  ?Procedure Laterality Date  ? MICRODISCECTOMY LUMBAR  2006  ? ? ?There were no vitals filed for this visit. ? ? Subjective Assessment - 11/01/21 1039   ? ? Subjective Patient reports he performed a lot of yard work activities over the weekend including lots of shoveling and leveling of dirt in his yard.  Patient reported no increase in low back pain or shoulder pain with these activities indicating improvement in his tolerance for higher level activities.  Patient also reports he was able to carry groceries sit them on the counter with no increase in shoulder pain indicating further improvement in his level of function.  Patient does report some soreness in his lower extremities but consider this to be a normal amount of soreness for the activities he performed.   ? Pertinent History PER MD NOTE FROM 06/02/2021 submitted by Alma Friendly, NP- Jesus Carroll is a 41 year old male with a past medical history of chronic midline low back pain without sciatica presents for acute shoulder pain  and continued lower back pain.   He was last seen for his back pain on 02/18/21 for an acute flare. Plain films of the lumbar spine were obtained which were unremarkable and he trialed cyclobenzaprine at night as well as Ibuprofen as needed to help with muscle relaxation.      Back pain: Pt had discectomy in 2006 and hs had chronic lower back pain since. In the past year, the pain is becoming more problematic. No radiculopathy. He has been taking the cyclobenzaprine, and he reports as being helpful. Activities with bending and/or heavy lifting aggravate the pain. He also takes 800 mg of Ibuprofen approximately 3 times per week, which he also finds helpful. He also uses [employer] which is helpful.     Shoulder pain: Left shoulder pain onset one month ago at the proximal bicep muscle. He describes it as in intermittent, stabbing pain with activities that require external rotation. He rates the pain as 8/10. He did not have any precipitating traumatic event.   ? Limitations Sitting;Lifting;Standing;House hold activities;Walking   ? How long can you sit comfortably? 1 hour   ? How long can you stand comfortably? 1 hour   ? How long can you walk comfortably? 40 min   ? Diagnostic tests 02/2021-IMPRESSION:  No acute osseous abnormality.     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   ? Patient Stated Goals Full  ROM for my shoulder as painfree as possible and improve my stiffness and pain in my back   ? Currently in Pain? No/denies   ? Pain Onset More than a month ago   ? ?  ?  ? ?  ? ? ? ?TREATMENT: ? ?Manual therapy ? ?Hamstring stretch 2x45 seconds bilaterally.  Knee-to-chest stretch 2 x 45 seconds bilaterally, piriformis stretch 2 x 45 seconds bilaterally ? ?Hooklying positioning ? ?Bridge with alt LE knee ext (in bridge position) x 12 reps each.  ? ?Bridge with Hip add squeeze using red soft ball 2 x 10 reps- 3 sec hold.  ? ?SL Shoulder ER 2 x 10 with 1 #  ?-Towel and axilla for improved rotator  cuff muscle activation ? ?Serratus punch with 3# with 30 sec hold with perturbations  ? ?Shoulder IR with cable system x 10 @ 7.5# ( easy rating) , x 10 @ 12.5# ?-Towel under shoulder for improved rotator cuff muscle activation ? ?Paloff press on cable system 2 x 10 ea direction with 7.5#  ? ? ?  No pain noted with any of the above activities some fatigue noted toward end of sets. ? ?Education provided throughout session via VC/TC and demonstration to facilitate movement at target joints and correct muscle activation for all testing and exercises performed.  ?  ? ? ? ? ? ? ? ? ? ? ? ? ? ? ? ? ? ? ? ? ? ? ? ? ? ? PT Education - 11/01/21 1040   ? ? Education Details Exercise form and technique   ? Person(s) Educated Patient   ? Methods Explanation;Demonstration   ? Comprehension Verbalized understanding;Returned demonstration   ? ?  ?  ? ?  ? ? ? PT Short Term Goals - 09/15/21 0945   ? ?  ? PT SHORT TERM GOAL #1  ? Title Pt will be independent with HEP in order to improve strength and decrease pain in order to improve pain-free function at home and work.   ? Baseline 07/27/2021- Patient presents with no formal HEP in place., 2/22: doing 2x a day   ? Time 6   ? Period Weeks   ? Status Achieved   ? Target Date 09/07/21   ? ?  ?  ? ?  ? ? ? ? PT Long Term Goals - 10/25/21 0857   ? ?  ? PT LONG TERM GOAL #1  ? Title Pt will improve FOTO to target score of 64 to display perceived improvements in ability to complete ADL's.   ? Baseline 07/27/2021=53, 2/22: 54% 10/25/21: 63   ? Time 8   ? Period Weeks   ? Status Partially Met   ? Target Date 12/20/21   ?  ? PT LONG TERM GOAL #2  ? Title Pt will decrease quick DASH score by at least 8% in order to demonstrate clinically significant reduction in disability.   ? Baseline 07/27/2021= 29.5 %, 2/22: 20.4%   ? Time 12   ? Period Weeks   ? Status Achieved   ? Target Date 10/19/21   ?  ? PT LONG TERM GOAL #3  ? Title Pt will decrease worst pain as reported on NPRS by at least 3 points in  order to demonstrate clinically significant reduction in low back and left shoudler pain.   ? Baseline 07/27/2021= 7/10 reported LPB and left shoulder pain, 2/22: worst pain 3/10 in low back, 0.5/10 in shoulder, 2/10 LBP ( waking  up and being sore) past week, 4/10 following golf workout in PT.   ? Time 12   ? Period Weeks   ? Status Partially Met   ? Target Date 10/19/21   ?  ? PT LONG TERM GOAL #4  ? Title Pt will increase Left UE strength in shoulder abduction/flexion to 4/5 in order to demonstrate improvement in strength and function for lifting and carrying.   ? Baseline 07/27/2021= 2-/5 left shoulder flex/abd, 2/22: LUE: 4+ flexion, 3+ abduction, 4/5 IR; L UE: 4-/5   ? Time 8   ? Period Weeks   ? Status On-going   ? Target Date 12/20/21   ?  ? PT LONG TERM GOAL #5  ? Title Patient will exhibits full rotation ROM in thoracolumbar spine without pain after 10+ repetitions to demonstrate improvement in tolerance with functional/recreational activities such as return to golf and playing with son.   ? Baseline Pt has pain in low back following completing exercises such as this exercises, pt also has pain with perofrming 10 repetitions ( around rep 8) of TL rotation to simulate golf activities   ? Time 8   ? Period Weeks   ? Status New   ? Target Date 12/20/21   ?  ? Additional Long Term Goals  ? Additional Long Term Goals Yes   ?  ? PT LONG TERM GOAL #6  ? Title Patient will report ability to complete golfing activities or simulating golfing activities without significant pain and discomfort in the day to 2 days following in order to improve his ability to participate in recreational activities.   ? Baseline Significant pain following golf simulation activities in previous sessions.   ? Time 8   ? Period Weeks   ? Status New   ? Target Date 12/20/21   ?  ? PT LONG TERM GOAL #7  ? Title Patient will demonstrate ability to lift 4 pound weight into and out of abduction of the left shoulder at 90 degrees in order to improve  patient's ability to lift object such as his water bottle off of his desk as well as complete activities around his home such as putting away groceries and putting away dishes.   ? Baseline patient reports

## 2021-11-08 ENCOUNTER — Ambulatory Visit: Payer: 59 | Admitting: Physical Therapy

## 2021-11-08 ENCOUNTER — Encounter: Payer: Self-pay | Admitting: Physical Therapy

## 2021-11-08 DIAGNOSIS — M545 Low back pain, unspecified: Secondary | ICD-10-CM | POA: Diagnosis not present

## 2021-11-08 DIAGNOSIS — M6281 Muscle weakness (generalized): Secondary | ICD-10-CM | POA: Diagnosis not present

## 2021-11-08 DIAGNOSIS — M25619 Stiffness of unspecified shoulder, not elsewhere classified: Secondary | ICD-10-CM | POA: Diagnosis not present

## 2021-11-08 DIAGNOSIS — G8929 Other chronic pain: Secondary | ICD-10-CM | POA: Diagnosis not present

## 2021-11-08 DIAGNOSIS — M25512 Pain in left shoulder: Secondary | ICD-10-CM | POA: Diagnosis not present

## 2021-11-08 NOTE — Therapy (Signed)
[place] [facility] ?[place], [place], [address] ?Phone: [phone]   Fax:  [phone] ? ?Physical Therapy Treatment ? ?Patient Details  ?Name: Jesus Carroll ?MRN: [id number] ?Date of Birth: 11/08/1980 ?Referring Provider (PT): Alma Friendly, NP ? ? ?Encounter Date: 11/08/2021 ? ? PT End of Session - 11/08/21 0836   ? ? Visit Number 18   ? Number of Visits 25   ? Date for PT Re-Evaluation 12/20/21   ? Authorization Type Initial Certification 02/27/276- 10/19/2021   ? Progress Note Due on Visit 20   ? PT Start Time 0845   ? PT Stop Time 0925   ? PT Time Calculation (min) 40 min   ? Activity Tolerance Patient tolerated treatment well;No increased pain   ? Behavior During Therapy [facility] for tasks assessed/performed   ? ?  ?  ? ?  ? ? ?Past Medical History:  ?Diagnosis Date  ? Allergic conjunctivitis 06/03/2019  ? Coughing/wheezing 06/03/2019  ? ? ?Past Surgical History:  ?Procedure Laterality Date  ? MICRODISCECTOMY LUMBAR  2006  ? ? ?There were no vitals filed for this visit. ? ? Subjective Assessment - 11/08/21 0835   ? ? Subjective Pt reports continued improvement in funciton with his low back pain.  Patient reports no shoulder limitations since previous session but he has not tried lifting any water bottles from abducted position which was a limiting movement previously.   ? Pertinent History PER MD NOTE FROM 06/02/2021 submitted by Alma Friendly, NP- MAURICIO DAHLEN is a 41 year old male with a past medical history of chronic midline low back pain without sciatica presents for acute shoulder pain and continued lower back pain.   He was last seen for his back pain on 02/18/21 for an acute flare. Plain films of the lumbar spine were obtained which were unremarkable and he trialed cyclobenzaprine at night as well as Ibuprofen as needed to help with muscle relaxation.      Back pain: Pt had discectomy in 2006 and hs had chronic lower back pain since. In the  past year, the pain is becoming more problematic. No radiculopathy. He has been taking the cyclobenzaprine, and he reports as being helpful. Activities with bending and/or heavy lifting aggravate the pain. He also takes 800 mg of Ibuprofen approximately 3 times per week, which he also finds helpful. He also uses [employer] which is helpful.     Shoulder pain: Left shoulder pain onset one month ago at the proximal bicep muscle. He describes it as in intermittent, stabbing pain with activities that require external rotation. He rates the pain as 8/10. He did not have any precipitating traumatic event.   ? Limitations Sitting;Lifting;Standing;House hold activities;Walking   ? How long can you sit comfortably? 1 hour   ? How long can you stand comfortably? 1 hour   ? How long can you walk comfortably? 40 min   ? Diagnostic tests 02/2021-IMPRESSION:  No acute osseous abnormality.     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   ? Patient Stated Goals Full ROM for my shoulder as painfree as possible and improve my stiffness and pain in my back   ? Pain Onset More than a month ago   ? ?  ?  ? ?  ? ? ? ?TREATMENT: ? ?Manual therapy ? ?Hamstring stretch 2x45 seconds bilaterally.  Knee-to-chest stretch 2 x 45 seconds bilaterally, piriformis stretch 2 x 45 seconds  bilaterally ?-decreased stiffness/tightness noted with these stretches today ? ?Therex: ? ?Hooklying positioning ? ?Bridge with alt LE knee ext (in bridge position) 2*-10 reps each.  ?- pt rates easy ? ?SL Shoulder ER 2 x 12 with 1 #  ?-Towel under axilla for improved rotator cuff muscle activation ? ?Serratus punch with 4# with 4 x 30 sec hold with perturbations  ? ?Shoulder IR with cable system 2x 10 @ 12.5# ?-Towel under shoulder for improved rotator cuff muscle activation ? ?Paloff press on cable system 1 x 10 ea direction with 7.5#  ?- with walking in pressed out position x 3 x 3 laps on each ( laterally stepping)  ? ?Ball roll outs 5 x 5 sec   ?- to provide some stretching/ mobility post addition of new exercises.  ? ?No pain noted with any of the above activities some fatigue noted toward end of sets. ? ?Education provided throughout session via VC/TC and demonstration to facilitate movement at target joints and correct muscle activation for all testing and exercises performed.  ?  ? ? ? ? ? ? ? ? ? ? ? ? ? ? ? ? ? ? ? ? ? ? ? ? ? ? ? ? PT Education - 11/08/21 0836   ? ? Education Details exercise form and technique   ? Person(s) Educated Patient   ? Methods Explanation   ? Comprehension Verbalized understanding   ? ?  ?  ? ?  ? ? ? PT Short Term Goals - 09/15/21 0945   ? ?  ? PT SHORT TERM GOAL #1  ? Title Pt will be independent with HEP in order to improve strength and decrease pain in order to improve pain-free function at home and work.   ? Baseline 07/27/2021- Patient presents with no formal HEP in place., 2/22: doing 2x a day   ? Time 6   ? Period Weeks   ? Status Achieved   ? Target Date 09/07/21   ? ?  ?  ? ?  ? ? ? ? PT Long Term Goals - 10/25/21 0857   ? ?  ? PT LONG TERM GOAL #1  ? Title Pt will improve FOTO to target score of 64 to display perceived improvements in ability to complete ADL's.   ? Baseline 07/27/2021=53, 2/22: 54% 10/25/21: 63   ? Time 8   ? Period Weeks   ? Status Partially Met   ? Target Date 12/20/21   ?  ? PT LONG TERM GOAL #2  ? Title Pt will decrease quick DASH score by at least 8% in order to demonstrate clinically significant reduction in disability.   ? Baseline 07/27/2021= 29.5 %, 2/22: 20.4%   ? Time 12   ? Period Weeks   ? Status Achieved   ? Target Date 10/19/21   ?  ? PT LONG TERM GOAL #3  ? Title Pt will decrease worst pain as reported on NPRS by at least 3 points in order to demonstrate clinically significant reduction in low back and left shoudler pain.   ? Baseline 07/27/2021= 7/10 reported LPB and left shoulder pain, 2/22: worst pain 3/10 in low back, 0.5/10 in shoulder, 2/10 LBP ( waking up and being sore) past week,  4/10 following golf workout in PT.   ? Time 12   ? Period Weeks   ? Status Partially Met   ? Target Date 10/19/21   ?  ? PT LONG TERM GOAL #4  ?  Title Pt will increase Left UE strength in shoulder abduction/flexion to 4/5 in order to demonstrate improvement in strength and function for lifting and carrying.   ? Baseline 07/27/2021= 2-/5 left shoulder flex/abd, 2/22: LUE: 4+ flexion, 3+ abduction, 4/5 IR; L UE: 4-/5   ? Time 8   ? Period Weeks   ? Status On-going   ? Target Date 12/20/21   ?  ? PT LONG TERM GOAL #5  ? Title Patient will exhibits full rotation ROM in thoracolumbar spine without pain after 10+ repetitions to demonstrate improvement in tolerance with functional/recreational activities such as return to golf and playing with son.   ? Baseline Pt has pain in low back following completing exercises such as this exercises, pt also has pain with perofrming 10 repetitions ( around rep 8) of TL rotation to simulate golf activities   ? Time 8   ? Period Weeks   ? Status New   ? Target Date 12/20/21   ?  ? Additional Long Term Goals  ? Additional Long Term Goals Yes   ?  ? PT LONG TERM GOAL #6  ? Title Patient will report ability to complete golfing activities or simulating golfing activities without significant pain and discomfort in the day to 2 days following in order to improve his ability to participate in recreational activities.   ? Baseline Significant pain following golf simulation activities in previous sessions.   ? Time 8   ? Period Weeks   ? Status New   ? Target Date 12/20/21   ?  ? PT LONG TERM GOAL #7  ? Title Patient will demonstrate ability to lift 4 pound weight into and out of abduction of the left shoulder at 90 degrees in order to improve patient's ability to lift object such as his water bottle off of his desk as well as complete activities around his home such as putting away groceries and putting away dishes.   ? Baseline patient reports pain with this activity when lifting his water  bottle with a weight of 3 to 5 pounds   ? Time 8   ? Period Weeks   ? Status New   ? Target Date 12/20/21   ? ?  ?  ? ?  ? ? ? ? ? ? ? ? Plan - 11/08/21 0836   ? ? Clinical Impression Statement Patient p

## 2021-11-15 ENCOUNTER — Encounter: Payer: Self-pay | Admitting: Physical Therapy

## 2021-11-15 ENCOUNTER — Ambulatory Visit: Payer: 59 | Admitting: Physical Therapy

## 2021-11-15 DIAGNOSIS — M25619 Stiffness of unspecified shoulder, not elsewhere classified: Secondary | ICD-10-CM | POA: Diagnosis not present

## 2021-11-15 DIAGNOSIS — M545 Low back pain, unspecified: Secondary | ICD-10-CM | POA: Diagnosis not present

## 2021-11-15 DIAGNOSIS — M6281 Muscle weakness (generalized): Secondary | ICD-10-CM | POA: Diagnosis not present

## 2021-11-15 DIAGNOSIS — G8929 Other chronic pain: Secondary | ICD-10-CM | POA: Diagnosis not present

## 2021-11-15 DIAGNOSIS — M25512 Pain in left shoulder: Secondary | ICD-10-CM | POA: Diagnosis not present

## 2021-11-15 NOTE — Therapy (Signed)
[place] [facility] ?[place], [place], [address] ?Phone: [phone]   Fax:  [phone] ? ?Physical Therapy Treatment ? ?Patient Details  ?Name: Jesus Carroll ?MRN: [id number] ?Date of Birth: 1981/05/17 ?Referring Provider (PT): Alma Friendly, NP ? ? ?Encounter Date: 11/15/2021 ? ? PT End of Session - 11/15/21 0853   ? ? Visit Number 19   ? Number of Visits 25   ? Date for PT Re-Evaluation 12/20/21   ? Authorization Type Initial Certification 07/27/2438- 10/19/2021   ? Progress Note Due on Visit 20   ? PT Start Time [phone]   ? PT Stop Time 0929   ? PT Time Calculation (min) 40 min   ? Activity Tolerance Patient tolerated treatment well;No increased pain   ? Behavior During Therapy [facility] for tasks assessed/performed   ? ?  ?  ? ?  ? ? ?Past Medical History:  ?Diagnosis Date  ? Allergic conjunctivitis 06/03/2019  ? Coughing/wheezing 06/03/2019  ? ? ?Past Surgical History:  ?Procedure Laterality Date  ? MICRODISCECTOMY LUMBAR  2006  ? ? ?There were no vitals filed for this visit. ? ? Subjective Assessment - 11/15/21 0848   ? ? Subjective Pt reports some soreness in his low back this date following camping over the weekend. Reports he got rained on a lot on his camping trip over the weekend.   ? Pertinent History PER MD NOTE FROM 06/02/2021 submitted by Alma Friendly, NP- COLLYN RIBAS is a 41 year old male with a past medical history of chronic midline low back pain without sciatica presents for acute shoulder pain and continued lower back pain.   He was last seen for his back pain on 02/18/21 for an acute flare. Plain films of the lumbar spine were obtained which were unremarkable and he trialed cyclobenzaprine at night as well as Ibuprofen as needed to help with muscle relaxation.      Back pain: Pt had discectomy in 2006 and hs had chronic lower back pain since. In the past year, the pain is becoming more problematic. No radiculopathy. He has been taking  the cyclobenzaprine, and he reports as being helpful. Activities with bending and/or heavy lifting aggravate the pain. He also takes 800 mg of Ibuprofen approximately 3 times per week, which he also finds helpful. He also uses [employer] which is helpful.     Shoulder pain: Left shoulder pain onset one month ago at the proximal bicep muscle. He describes it as in intermittent, stabbing pain with activities that require external rotation. He rates the pain as 8/10. He did not have any precipitating traumatic event.   ? Limitations Sitting;Lifting;Standing;House hold activities;Walking   ? How long can you sit comfortably? 1 hour   ? How long can you stand comfortably? 1 hour   ? How long can you walk comfortably? 40 min   ? Diagnostic tests 02/2021-IMPRESSION:  No acute osseous abnormality.     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   ? Patient Stated Goals Full ROM for my shoulder as painfree as possible and improve my stiffness and pain in my back   ? Currently in Pain? Yes   ? Pain Score 2    ? Pain Location Back   ? Pain Orientation Lower   ? Pain Descriptors / Indicators Sore   ? Pain Type Chronic pain   ? Pain Onset In the past 7 days   ? Pain Frequency  Occasional   ? Aggravating Factors  camping and sleeping on ground   ? ?  ?  ? ?  ? ? ? ? ?TREATMENT: ? ?Manual therapy ? ?Hamstring stretch 2x45 seconds bilaterally.  Knee-to-chest stretch 2 x 45 seconds bilaterally, piriformis stretch 2 x 45 seconds bilaterally ?-decreased stiffness/tightness noted with these stretches today ?Soft tissue mobilization to lower paraspinals bilaterally.  Large trigger point noticed on right side. ? ?Therex: ? ?Hooklying positioning ? ?Bridge with alt LE knee ext (in bridge position) 2*-10 reps each.  ?- pt rates easy ? ?SL Shoulder ER 2 x 15 with 1 #  ?-Towel under axilla for improved rotator cuff muscle activation ? ?Serratus punch with 4# with 90-125 degree 2 x 10  ?-good serratus activation and control   ? ?Shoulder IR with cable system 2 x 12 @ 12.5# ?-Towel under shoulder for improved rotator cuff muscle activation ? ?Paloff press with 7.5#  ?- with walking in pressed out position x 4 x 3 laps on each (laterally stepping)  ? ?Ball roll outs 5 x 5 sec each direction  ?- to provide some stretching/ mobility post addition of new exercises and prior to workday ? ?Education provided throughout session via VC/TC and demonstration to facilitate movement at target joints and correct muscle activation for all testing and exercises performed.  ?  ? ? ? ? ? ? ? ? ? ? ? ? ? ? ? ? ? ? ? ? ? ? ? ? ? ? ? ? ? PT Education - 11/15/21 0851   ? ? Education Details exercise form and technique   ? Person(s) Educated Patient   ? Methods Explanation   ? Comprehension Verbalized understanding   ? ?  ?  ? ?  ? ? ? PT Short Term Goals - 09/15/21 0945   ? ?  ? PT SHORT TERM GOAL #1  ? Title Pt will be independent with HEP in order to improve strength and decrease pain in order to improve pain-free function at home and work.   ? Baseline 07/27/2021- Patient presents with no formal HEP in place., 2/22: doing 2x a day   ? Time 6   ? Period Weeks   ? Status Achieved   ? Target Date 09/07/21   ? ?  ?  ? ?  ? ? ? ? PT Long Term Goals - 10/25/21 0857   ? ?  ? PT LONG TERM GOAL #1  ? Title Pt will improve FOTO to target score of 64 to display perceived improvements in ability to complete ADL's.   ? Baseline 07/27/2021=53, 2/22: 54% 10/25/21: 63   ? Time 8   ? Period Weeks   ? Status Partially Met   ? Target Date 12/20/21   ?  ? PT LONG TERM GOAL #2  ? Title Pt will decrease quick DASH score by at least 8% in order to demonstrate clinically significant reduction in disability.   ? Baseline 07/27/2021= 29.5 %, 2/22: 20.4%   ? Time 12   ? Period Weeks   ? Status Achieved   ? Target Date 10/19/21   ?  ? PT LONG TERM GOAL #3  ? Title Pt will decrease worst pain as reported on NPRS by at least 3 points in order to demonstrate clinically significant reduction in  low back and left shoudler pain.   ? Baseline 07/27/2021= 7/10 reported LPB and left shoulder pain, 2/22: worst pain 3/10 in low back, 0.5/10 in shoulder,  2/10 LBP ( waking up and being sore) past week, 4/10 following golf workout in PT.   ? Time 12   ? Period Weeks   ? Status Partially Met   ? Target Date 10/19/21   ?  ? PT LONG TERM GOAL #4  ? Title Pt will increase Left UE strength in shoulder abduction/flexion to 4/5 in order to demonstrate improvement in strength and function for lifting and carrying.   ? Baseline 07/27/2021= 2-/5 left shoulder flex/abd, 2/22: LUE: 4+ flexion, 3+ abduction, 4/5 IR; L UE: 4-/5   ? Time 8   ? Period Weeks   ? Status On-going   ? Target Date 12/20/21   ?  ? PT LONG TERM GOAL #5  ? Title Patient will exhibits full rotation ROM in thoracolumbar spine without pain after 10+ repetitions to demonstrate improvement in tolerance with functional/recreational activities such as return to golf and playing with son.   ? Baseline Pt has pain in low back following completing exercises such as this exercises, pt also has pain with perofrming 10 repetitions ( around rep 8) of TL rotation to simulate golf activities   ? Time 8   ? Period Weeks   ? Status New   ? Target Date 12/20/21   ?  ? Additional Long Term Goals  ? Additional Long Term Goals Yes   ?  ? PT LONG TERM GOAL #6  ? Title Patient will report ability to complete golfing activities or simulating golfing activities without significant pain and discomfort in the day to 2 days following in order to improve his ability to participate in recreational activities.   ? Baseline Significant pain following golf simulation activities in previous sessions.   ? Time 8   ? Period Weeks   ? Status New   ? Target Date 12/20/21   ?  ? PT LONG TERM GOAL #7  ? Title Patient will demonstrate ability to lift 4 pound weight into and out of abduction of the left shoulder at 90 degrees in order to improve patient's ability to lift object such as his water bottle  off of his desk as well as complete activities around his home such as putting away groceries and putting away dishes.   ? Baseline patient reports pain with this activity when lifting his water bottl

## 2021-11-23 ENCOUNTER — Ambulatory Visit: Payer: 59 | Admitting: Physical Therapy

## 2021-11-24 ENCOUNTER — Encounter: Payer: Self-pay | Admitting: Physical Therapy

## 2021-11-24 ENCOUNTER — Ambulatory Visit: Payer: 59 | Attending: Primary Care | Admitting: Physical Therapy

## 2021-11-24 DIAGNOSIS — M25619 Stiffness of unspecified shoulder, not elsewhere classified: Secondary | ICD-10-CM | POA: Diagnosis not present

## 2021-11-24 DIAGNOSIS — G8929 Other chronic pain: Secondary | ICD-10-CM | POA: Diagnosis not present

## 2021-11-24 DIAGNOSIS — M6281 Muscle weakness (generalized): Secondary | ICD-10-CM | POA: Diagnosis not present

## 2021-11-24 DIAGNOSIS — M545 Low back pain, unspecified: Secondary | ICD-10-CM | POA: Diagnosis not present

## 2021-11-24 DIAGNOSIS — M25512 Pain in left shoulder: Secondary | ICD-10-CM | POA: Insufficient documentation

## 2021-11-24 NOTE — Therapy (Signed)
[place] [facility] ?[place], [place], [address] ?Phone: [phone]   Fax:  [phone] ? ?Physical Therapy Treatment ? ?Patient Details  ?Name: Jesus Carroll ?MRN: [id number] ?Date of Birth: February 12, 1981 ?Referring Provider (PT): Alma Friendly, NP ? ? ?Encounter Date: 11/24/2021 ? ? PT End of Session - 11/24/21 0914   ? ? Visit Number 20   ? Number of Visits 25   ? Date for PT Re-Evaluation 12/20/21   ? Authorization Type Initial Certification 09/25/3830- 10/19/2021   ? Progress Note Due on Visit 20   ? PT Start Time 0805   ? PT Stop Time 0840   ? PT Time Calculation (min) 35 min   ? Activity Tolerance Patient tolerated treatment well;No increased pain   ? Behavior During Therapy [facility] for tasks assessed/performed   ? ?  ?  ? ?  ? ? ?Past Medical History:  ?Diagnosis Date  ? Allergic conjunctivitis 06/03/2019  ? Coughing/wheezing 06/03/2019  ? ? ?Past Surgical History:  ?Procedure Laterality Date  ? MICRODISCECTOMY LUMBAR  2006  ? ? ?There were no vitals filed for this visit. ? ? Subjective Assessment - 11/24/21 0817   ? ? Subjective Pt reports episode of back spasm this weekend and was sore for a few days but reports no pain currently; Reports shoulder is good- no pain, He reports fatigue and feeling "weaker" when lifting heavier items.   ? Pertinent History PER MD NOTE FROM 06/02/2021 submitted by Alma Friendly, NP- HAMMAD FINKLER is a 41 year old male with a past medical history of chronic midline low back pain without sciatica presents for acute shoulder pain and continued lower back pain.   He was last seen for his back pain on 02/18/21 for an acute flare. Plain films of the lumbar spine were obtained which were unremarkable and he trialed cyclobenzaprine at night as well as Ibuprofen as needed to help with muscle relaxation.      Back pain: Pt had discectomy in 2006 and hs had chronic lower back pain since. In the past year, the pain is becoming more  problematic. No radiculopathy. He has been taking the cyclobenzaprine, and he reports as being helpful. Activities with bending and/or heavy lifting aggravate the pain. He also takes 800 mg of Ibuprofen approximately 3 times per week, which he also finds helpful. He also uses [employer] which is helpful.     Shoulder pain: Left shoulder pain onset one month ago at the proximal bicep muscle. He describes it as in intermittent, stabbing pain with activities that require external rotation. He rates the pain as 8/10. He did not have any precipitating traumatic event.   ? Limitations Sitting;Lifting;Standing;House hold activities;Walking   ? How long can you sit comfortably? 1 hour   ? How long can you stand comfortably? 1 hour   ? How long can you walk comfortably? 40 min   ? Diagnostic tests 02/2021-IMPRESSION:  No acute osseous abnormality.     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   ? Patient Stated Goals Full ROM for my shoulder as painfree as possible and improve my stiffness and pain in my back   ? Currently in Pain? No/denies   ? Pain Onset In the past 7 days   ? Multiple Pain Sites No   ? ?  ?  ? ?  ? ? ? ? ? [facility] PT Assessment - 11/24/21 0001   ? ?  ?  Observation/Other Assessments  ? Other Surveys  Quick Dash   ? Quick DASH  9%   ?  ? ROM / Strength  ? AROM / PROM / Strength Strength   ?  ? Strength  ? Strength Assessment Site Shoulder   ? Right/Left Shoulder Left   ? Left Shoulder Flexion 5/5   ? Left Shoulder Extension 5/5   ? Left Shoulder ABduction 4+/5   ? Left Shoulder Internal Rotation 4+/5   ? Left Shoulder External Rotation 4+/5   ? ?  ?  ? ?  ?TREATMENT: ?Hooklying: ?Lumbar trunk rotation x5 reps each ?Single knee to chest stretch 20 sec hold x3 reps each; ?Bridge with alternate LAQ to challenge core/hip control x10 reps each; ?Green tband around BLE: ? Alternate march x10 reps each with cues for core stabilization;  ?Progressed core strengthening: ? Small ball between legs:  BLE leg lift x5 reps; required min VCS for core stabilization to avoid lumbar extension during BLE leg lift;  ? ? ?Instructed patient in golf swing- #10 swings with full thoracolumbar rotation: reports no pain;  ? ?Patient instructed in outcome measures to address progress towards goals ? ?Patient's condition has the potential to improve in response to therapy. Maximum improvement is yet to be obtained. The anticipated improvement is attainable and reasonable in a generally predictable time.  Patient reports adherence with HEP. He reports doing really well, but did have back spasm this weekend which was significantly painful and limiting for a few days.  ? ? ? ? ? ? ? ? ? ? ? ? ? ? ? ? ? ? ? ? ? ? ? ? PT Short Term Goals - 09/15/21 0945   ? ?  ? PT SHORT TERM GOAL #1  ? Title Pt will be independent with HEP in order to improve strength and decrease pain in order to improve pain-free function at home and work.   ? Baseline 07/27/2021- Patient presents with no formal HEP in place., 2/22: doing 2x a day   ? Time 6   ? Period Weeks   ? Status Achieved   ? Target Date 09/07/21   ? ?  ?  ? ?  ? ? ? ? PT Long Term Goals - 11/24/21 0824   ? ?  ? PT LONG TERM GOAL #1  ? Title Pt will improve FOTO to target score of 64 to display perceived improvements in ability to complete ADL's.   ? Baseline 07/27/2021=53, 2/22: 54% 10/25/21: 63, 5/3: 57%   ? Time 8   ? Period Weeks   ? Status Partially Met   ? Target Date 12/20/21   ?  ? PT LONG TERM GOAL #2  ? Title Pt will decrease quick DASH score by at least 8% in order to demonstrate clinically significant reduction in disability.   ? Baseline 07/27/2021= 29.5 %, 2/22: 20.4%, 5/3: 9%   ? Time 12   ? Period Weeks   ? Status Achieved   ? Target Date 10/19/21   ?  ? PT LONG TERM GOAL #3  ? Title Pt will decrease worst pain as reported on NPRS by at least 3 points in order to demonstrate clinically significant reduction in low back and left shoudler pain.   ? Baseline 07/27/2021= 7/10 reported LPB  and left shoulder pain, 2/22: worst pain 3/10 in low back, 0.5/10 in shoulder, 2/10 LBP ( waking up and being sore) past week, 4/10 following golf workout in PT. 5/3: 10/10  back spasm on Saturday, 0/10 shoulder   ? Time 12   ? Period Weeks   ? Status Partially Met   ? Target Date 12/20/21   ?  ? PT LONG TERM GOAL #4  ? Title Pt will increase Left UE strength in shoulder abduction/flexion to 4/5 in order to demonstrate improvement in strength and function for lifting and carrying.   ? Baseline 07/27/2021= 2-/5 left shoulder flex/abd, 2/22: LUE: 4+ flexion, 3+ abduction, 4/5 IR; L UE: 4-/5, 5/3: see flowsheet   ? Time 8   ? Period Weeks   ? Status On-going   ? Target Date 12/20/21   ?  ? PT LONG TERM GOAL #5  ? Title Patient will exhibits full rotation ROM in thoracolumbar spine without pain after 10+ repetitions to demonstrate improvement in tolerance with functional/recreational activities such as return to golf and playing with son.   ? Baseline Pt has pain in low back following completing exercises such as this exercises, pt also has pain with perofrming 10 repetitions ( around rep 8) of TL rotation to simulate golf activities, 5/3: exhibits full motion, no pain;   ? Time 8   ? Period Weeks   ? Status Achieved   ? Target Date 12/20/21   ?  ? PT LONG TERM GOAL #6  ? Title Patient will report ability to complete golfing activities or simulating golfing activities without significant pain and discomfort in the day to 2 days following in order to improve his ability to participate in recreational activities.   ? Baseline Significant pain following golf simulation activities in previous sessions. 5/3: hasn't been able to play due to weather   ? Time 8   ? Period Weeks   ? Status On-going   ? Target Date 12/20/21   ?  ? PT LONG TERM GOAL #7  ? Title Patient will demonstrate ability to lift 4 pound weight into and out of abduction of the left shoulder at 90 degrees in order to improve patient's ability to lift object such  as his water bottle off of his desk as well as complete activities around his home such as putting away groceries and putting away dishes.   ? Baseline patient reports pain with this activity when lift

## 2021-11-28 IMAGING — CR DG LUMBAR SPINE COMPLETE 4+V
5 series · 5 of 5 positions shown · non-contrast
Comparison: None.

CLINICAL DATA: Acute on chronic back pain.

EXAM:
LUMBAR SPINE - COMPLETE 4+ VIEW

[l-spine ap]
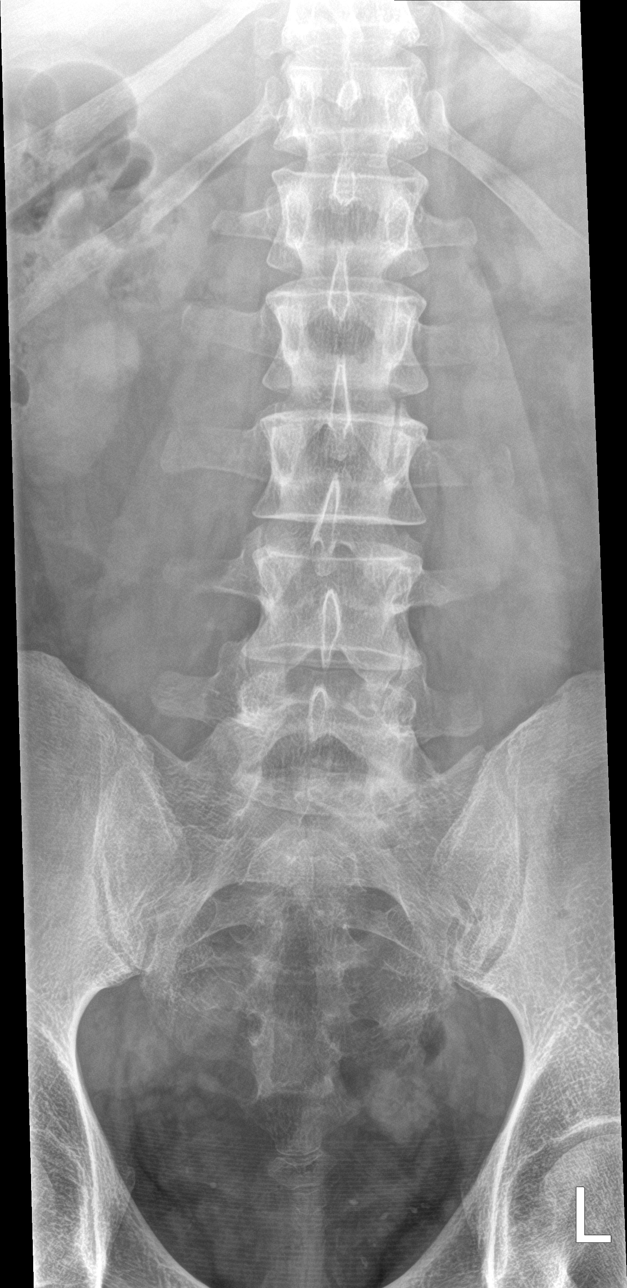

[l-spine obl (1 of 2)]
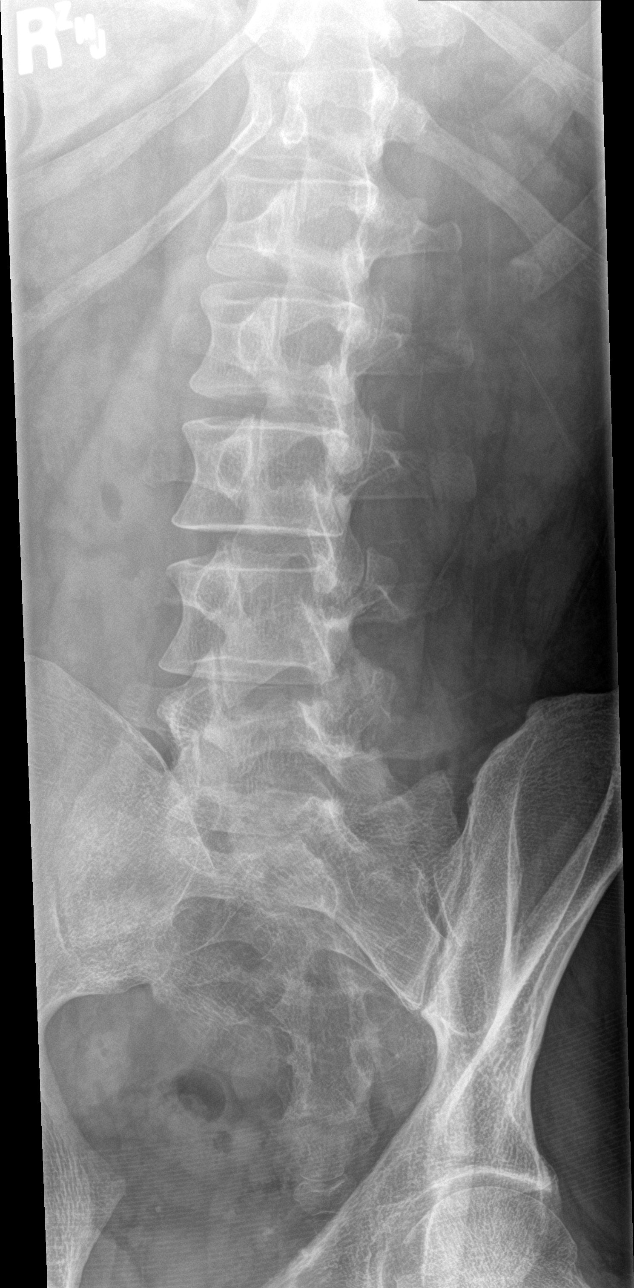

[l-spine obl (2 of 2)]
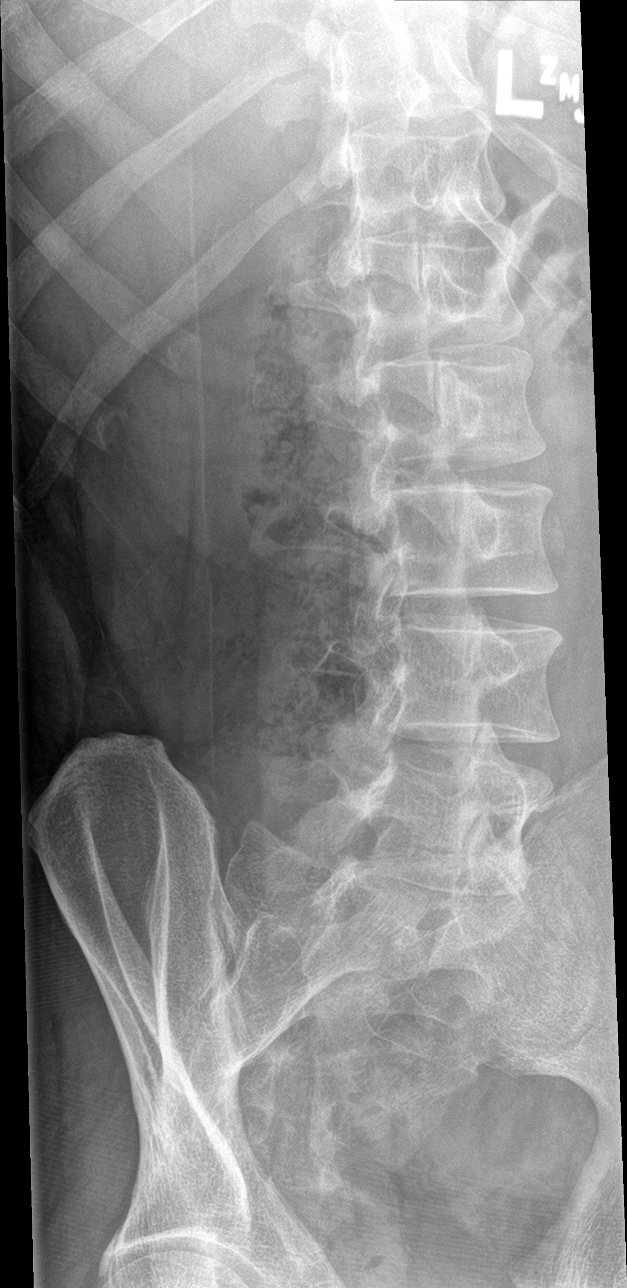

[l-spine lat]
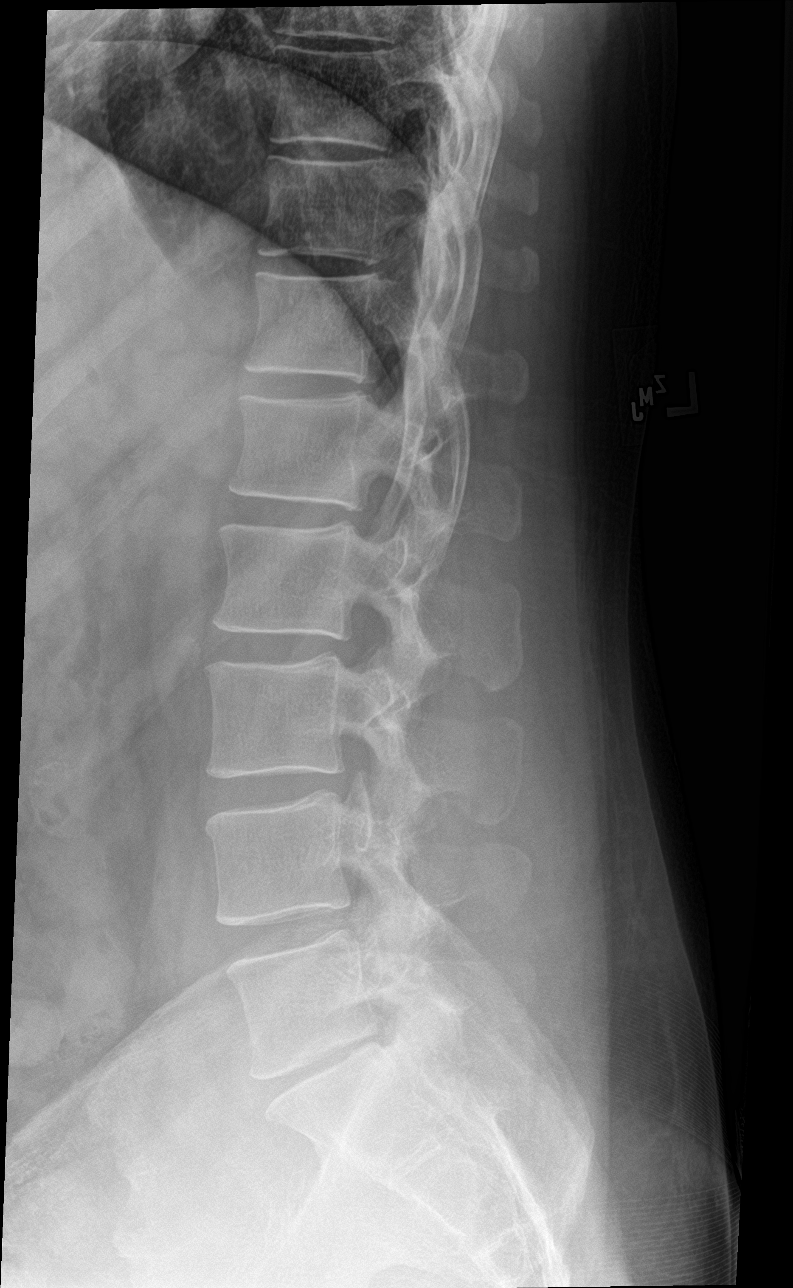

[l-spine spot]
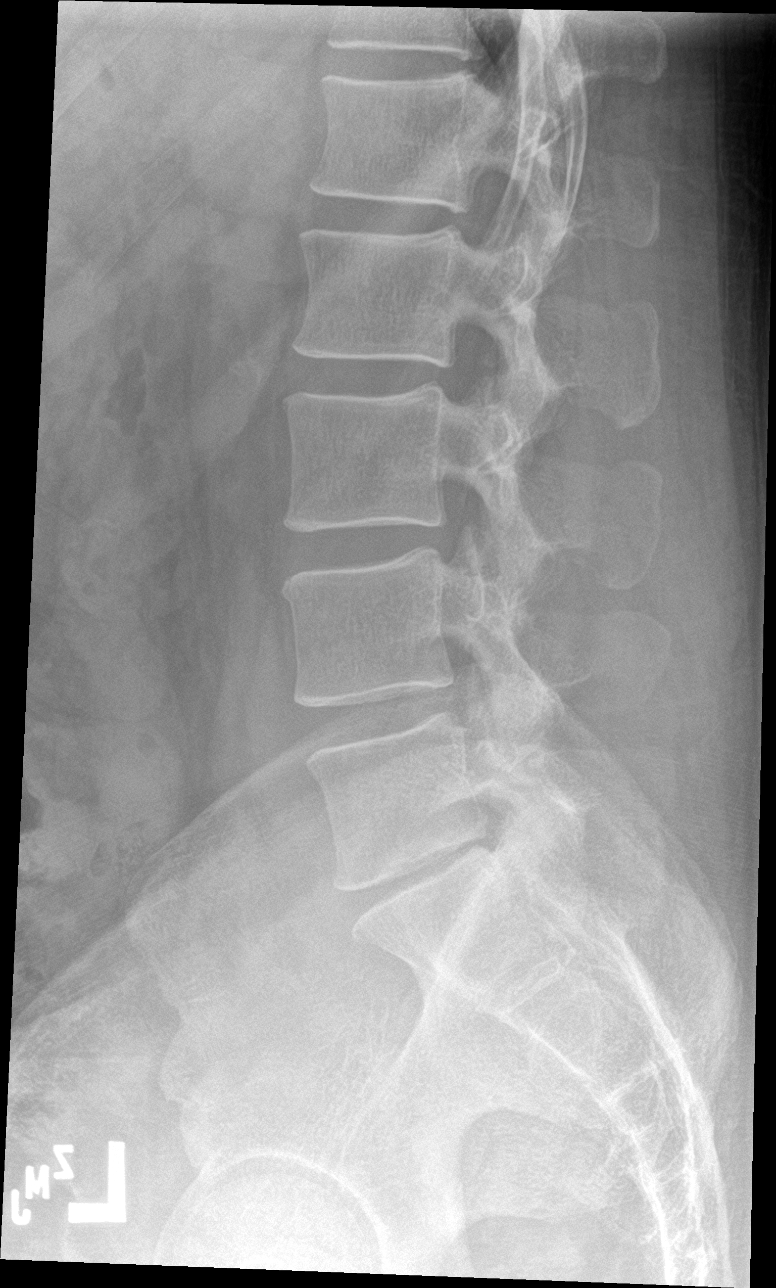

[5 of 5 positions shown; findings below may reference images not displayed]

FINDINGS: Five lumbar type vertebral bodies. There is no evidence of lumbar
spine fracture. Alignment is normal. L5-S1 discogenic disease with
mild lower lumbar facet arthropathy.
IMPRESSION: No acute osseous abnormality.

L5-S1 discogenic disease and mild lower lumbar facet arthropathy.

## 2021-12-01 ENCOUNTER — Ambulatory Visit: Payer: 59

## 2021-12-01 DIAGNOSIS — M545 Low back pain, unspecified: Secondary | ICD-10-CM | POA: Diagnosis not present

## 2021-12-01 DIAGNOSIS — M6281 Muscle weakness (generalized): Secondary | ICD-10-CM | POA: Diagnosis not present

## 2021-12-01 DIAGNOSIS — G8929 Other chronic pain: Secondary | ICD-10-CM

## 2021-12-01 DIAGNOSIS — M25619 Stiffness of unspecified shoulder, not elsewhere classified: Secondary | ICD-10-CM

## 2021-12-01 DIAGNOSIS — M25512 Pain in left shoulder: Secondary | ICD-10-CM | POA: Diagnosis not present

## 2021-12-01 NOTE — Therapy (Signed)
[facility] [facility] ?[place], [place], [address] ?Phone: [phone]   Fax:  [phone] ? ?Physical Therapy Treatment ? ?Patient Details  ?Name: Jesus Carroll ?MRN: [id number] ?Date of Birth: 1981/03/01 ?Referring Provider (PT): Jesus Friendly, NP ? ? ?Encounter Date: 12/01/2021 ? ? PT End of Session - 12/01/21 2355   ? ? Visit Number 21   ? Number of Visits 25   ? Date for PT Re-Evaluation 12/20/21   ? Authorization Type [employer]   ? Authorization Time Period 07/27/2021- 10/19/2021; 10/25/21-12/20/21   ? Progress Note Due on Visit 30   ? PT Start Time 0802   ? PT Stop Time [phone]   ? PT Time Calculation (min) 40 min   ? Activity Tolerance Patient tolerated treatment well;No increased pain   ? Behavior During Therapy [facility] for tasks assessed/performed   ? ?  ?  ? ?  ? ? ?Past Medical History:  ?Diagnosis Date  ? Allergic conjunctivitis 06/03/2019  ? Coughing/wheezing 06/03/2019  ? ? ?Past Surgical History:  ?Procedure Laterality Date  ? MICRODISCECTOMY LUMBAR  2006  ? ? ?There were no vitals filed for this visit. ? ? Subjective Assessment - 12/01/21 0809   ? ? Subjective Pt doing well today, no pain in shoulder or back. Asks about ways to keep his back comfortable while flying to [place] this week.   ? Pertinent History PER MD NOTE FROM 06/02/2021 submitted by Jesus Friendly, NP- Jesus Carroll is a 41 year old male with a past medical history of chronic midline low back pain without sciatica presents for acute shoulder pain and continued lower back pain.   He was last seen for his back pain on 02/18/21 for an acute flare. Plain films of the lumbar spine were obtained which were unremarkable and he trialed cyclobenzaprine at night as well as Ibuprofen as needed to help with muscle relaxation.      Back pain: Pt had discectomy in 2006 and hs had chronic lower back pain since. In the past year, the pain is becoming more problematic. No radiculopathy. He  has been taking the cyclobenzaprine, and he reports as being helpful. Activities with bending and/or heavy lifting aggravate the pain. He also takes 800 mg of Ibuprofen approximately 3 times per week, which he also finds helpful. He also uses [employer] which is helpful.     Shoulder pain: Left shoulder pain onset one month ago at the proximal bicep muscle. He describes it as in intermittent, stabbing pain with activities that require external rotation. He rates the pain as 8/10. He did not have any precipitating traumatic event.   ? Diagnostic tests 02/2021-IMPRESSION:  No acute osseous abnormality.     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   ? Patient Stated Goals Full ROM for my shoulder as painfree as possible and improve my stiffness and pain in my back   ? Currently in Pain? No/denies   ? ?  ?  ? ?  ? ? ?INTERVENTION THIS DATE: ?-hamstrings stretch 2x45secH  ?-SKTC 2x45sec bilat  ?-hooklying FABER stretch 2x30sec  ?-bridge to single LAQ 1x20 alternating ? ?Supine Left GHJ IR 5lb 1x15 (90/90)  ?Serratus punch with 4# with 90-125 degree 1x12   ?-good serratus activation and control  ?Supine Left GHJ IR 7lb 1x15 (90/90)  ?Serratus punch with 4# with 90-125 degree 1x12 ? ?Seated LUE ER 1x15 @ 5lb  ?Standing LUE lateral  raise 1x12 @ 4lb  ?Seated row down cable tower 27.5lb 1x12  ? ?Seated LUE ER 1x15 @ 7lb  ?Standing LUE lateral raise 1x12 @ 4lb  ?Seated row down cable tower 27.5lb 1x12  ? ?Isometric RDL c row x5 (4 sets back to back) (7lb FW x2)  ? ? ? PT Education - 12/01/21 0814   ? ? Education Details back mobility on the plane   ? Person(s) Educated Patient   ? Methods Explanation;Demonstration   ? Comprehension Need further instruction;Verbalized understanding   ? ?  ?  ? ?  ? ? ? PT Short Term Goals - 09/15/21 0945   ? ?  ? PT SHORT TERM GOAL #1  ? Title Pt will be independent with HEP in order to improve strength and decrease pain in order to improve pain-free function at home and  work.   ? Baseline 07/27/2021- Patient presents with no formal HEP in place., 2/22: doing 2x a day   ? Time 6   ? Period Weeks   ? Status Achieved   ? Target Date 09/07/21   ? ?  ?  ? ?  ? ? ? ? PT Long Term Goals - 11/24/21 0824   ? ?  ? PT LONG TERM GOAL #1  ? Title Pt will improve FOTO to target score of 64 to display perceived improvements in ability to complete ADL's.   ? Baseline 07/27/2021=53, 2/22: 54% 10/25/21: 63, 5/3: 57%   ? Time 8   ? Period Weeks   ? Status Partially Met   ? Target Date 12/20/21   ?  ? PT LONG TERM GOAL #2  ? Title Pt will decrease quick DASH score by at least 8% in order to demonstrate clinically significant reduction in disability.   ? Baseline 07/27/2021= 29.5 %, 2/22: 20.4%, 5/3: 9%   ? Time 12   ? Period Weeks   ? Status Achieved   ? Target Date 10/19/21   ?  ? PT LONG TERM GOAL #3  ? Title Pt will decrease worst pain as reported on NPRS by at least 3 points in order to demonstrate clinically significant reduction in low back and left shoudler pain.   ? Baseline 07/27/2021= 7/10 reported LPB and left shoulder pain, 2/22: worst pain 3/10 in low back, 0.5/10 in shoulder, 2/10 LBP ( waking up and being sore) past week, 4/10 following golf workout in PT. 5/3: 10/10 back spasm on Saturday, 0/10 shoulder   ? Time 12   ? Period Weeks   ? Status Partially Met   ? Target Date 12/20/21   ?  ? PT LONG TERM GOAL #4  ? Title Pt will increase Left UE strength in shoulder abduction/flexion to 4/5 in order to demonstrate improvement in strength and function for lifting and carrying.   ? Baseline 07/27/2021= 2-/5 left shoulder flex/abd, 2/22: LUE: 4+ flexion, 3+ abduction, 4/5 IR; L UE: 4-/5, 5/3: see flowsheet   ? Time 8   ? Period Weeks   ? Status On-going   ? Target Date 12/20/21   ?  ? PT LONG TERM GOAL #5  ? Title Patient will exhibits full rotation ROM in thoracolumbar spine without pain after 10+ repetitions to demonstrate improvement in tolerance with functional/recreational activities such as return  to golf and playing with son.   ? Baseline Pt has pain in low back following completing exercises such as this exercises, pt also has pain with perofrming 10 repetitions ( around  rep 8) of TL rotation to simulate golf activities, 5/3: exhibits full motion, no pain;   ? Time 8   ? Period Weeks   ? Status Achieved   ? Target Date 12/20/21   ?  ? PT LONG TERM GOAL #6  ? Title Patient will report ability to complete golfing activities or simulating golfing activities without significant pain and discomfort in the day to 2 days following in order to improve his ability to participate in recreational activities.   ? Baseline Significant pain following golf simulation activities in previous sessions. 5/3: hasn't been able to play due to weather   ? Time 8   ? Period Weeks   ? Status On-going   ? Target Date 12/20/21   ?  ? PT LONG TERM GOAL #7  ? Title Patient will demonstrate ability to lift 4 pound weight into and out of abduction of the left shoulder at 90 degrees in order to improve patient's ability to lift object such as his water bottle off of his desk as well as complete activities around his home such as putting away groceries and putting away dishes.   ? Baseline patient reports pain with this activity when lifting his water bottle with a weight of 3 to 5 pounds, 5/3: no pain lifting weight overhead   ? Time 8   ? Period Weeks   ? Status Achieved   ? Target Date 12/20/21   ? ?  ?  ? ?  ? ? ? ? ? ? ? ? Plan - 12/01/21 0814   ? ? Clinical Impression Statement Conitnued with gentle ROM and activation. Progressed positions for rotator cuff strengthening. Began to expand more on back and core endurance work. Pt tolerates session well in general, no pain exacerbation.   ? Examination-Activity Limitations Bend;Carry;Lift;Stand;Squat;Reach Overhead   ? Examination-Participation Restrictions Cleaning;Community Activity;[employer] Work   ? Stability/Clinical Decision Making Evolving/Moderate complexity   ? Clinical Decision  Making Low   ? Rehab Potential Good   ? PT Frequency 2x / week   ? PT Duration 12 weeks   ? PT Treatment/Interventions ADLs/Self Care Home Management;Cryotherapy;Electrical Stimulation;Moist Heat;Traction;S

## 2021-12-08 ENCOUNTER — Ambulatory Visit: Payer: 59 | Admitting: Physical Therapy

## 2021-12-10 ENCOUNTER — Telehealth: Payer: 59 | Admitting: Emergency Medicine

## 2021-12-10 DIAGNOSIS — M549 Dorsalgia, unspecified: Secondary | ICD-10-CM | POA: Insufficient documentation

## 2021-12-10 DIAGNOSIS — M79671 Pain in right foot: Secondary | ICD-10-CM | POA: Insufficient documentation

## 2021-12-10 DIAGNOSIS — Z87442 Personal history of urinary calculi: Secondary | ICD-10-CM | POA: Insufficient documentation

## 2021-12-10 DIAGNOSIS — M79673 Pain in unspecified foot: Secondary | ICD-10-CM

## 2021-12-10 NOTE — Progress Notes (Signed)
Pt is in [place] right now, not in [place] or [place] where I am licensed. Advised pt I cannot provide care for him in [place]. Directed him to local urgent care or [facility] for video visit.

## 2021-12-13 ENCOUNTER — Ambulatory Visit: Payer: 59 | Admitting: Physical Therapy

## 2021-12-22 ENCOUNTER — Ambulatory Visit: Payer: 59 | Admitting: Physical Therapy

## 2021-12-22 ENCOUNTER — Encounter: Payer: Self-pay | Admitting: Physical Therapy

## 2021-12-22 DIAGNOSIS — M545 Low back pain, unspecified: Secondary | ICD-10-CM

## 2021-12-22 DIAGNOSIS — M6281 Muscle weakness (generalized): Secondary | ICD-10-CM | POA: Diagnosis not present

## 2021-12-22 DIAGNOSIS — G8929 Other chronic pain: Secondary | ICD-10-CM

## 2021-12-22 DIAGNOSIS — M25619 Stiffness of unspecified shoulder, not elsewhere classified: Secondary | ICD-10-CM | POA: Diagnosis not present

## 2021-12-22 DIAGNOSIS — M25512 Pain in left shoulder: Secondary | ICD-10-CM | POA: Diagnosis not present

## 2021-12-22 NOTE — Therapy (Signed)
[place] [facility] [address] [place], [place], [address] Phone: [phone]   Fax:  [phone]  Physical Therapy Treatment/ PT Discharge Summary   Patient Details  Name: Jesus Carroll MRN: [id number] Date of Birth: 07-10-1981 Referring Provider (PT): Alma Friendly, NP   Encounter Date: 12/22/2021   PT End of Session - 12/22/21 0809     Visit Number 22    Number of Visits 25    Date for PT Re-Evaluation 12/20/21    Authorization Type [employer]    Authorization Time Period 07/27/2021- 10/19/2021; 10/25/21-12/20/21    Progress Note Due on Visit 30    PT Start Time 0804    PT Stop Time 0819    PT Time Calculation (min) 15 min    Activity Tolerance Patient tolerated treatment well;No increased pain    Behavior During Therapy [facility] for tasks assessed/performed             Past Medical History:  Diagnosis Date   Allergic conjunctivitis 06/03/2019   Coughing/wheezing 06/03/2019    Past Surgical History:  Procedure Laterality Date   MICRODISCECTOMY LUMBAR  2006    There were no vitals filed for this visit.   Subjective Assessment - 12/22/21 0806     Subjective Pt reports doing well with no pain over his vacation to [place]. Pt reports he had some foot pain following hiking in [place]. He reports he feels it is getting better.    Pertinent History PER MD NOTE FROM 06/02/2021 submitted by Alma Friendly, NP- Jesus Carroll is a 41 year old male with a past medical history of chronic midline low back pain without sciatica presents for acute shoulder pain and continued lower back pain.   He was last seen for his back pain on 02/18/21 for an acute flare. Plain films of the lumbar spine were obtained which were unremarkable and he trialed cyclobenzaprine at night as well as Ibuprofen as needed to help with muscle relaxation.      Back pain: Pt had discectomy in 2006 and hs had chronic lower back pain since. In the past year,  the pain is becoming more problematic. No radiculopathy. He has been taking the cyclobenzaprine, and he reports as being helpful. Activities with bending and/or heavy lifting aggravate the pain. He also takes 800 mg of Ibuprofen approximately 3 times per week, which he also finds helpful. He also uses [employer] which is helpful.     Shoulder pain: Left shoulder pain onset one month ago at the proximal bicep muscle. He describes it as in intermittent, stabbing pain with activities that require external rotation. He rates the pain as 8/10. He did not have any precipitating traumatic event.    Limitations Sitting;Lifting;Standing;House hold activities;Walking    How long can you sit comfortably? 1 hour    How long can you stand comfortably? 1 hour    How long can you walk comfortably? 40 min    Diagnostic tests 02/2021-IMPRESSION:  No acute osseous abnormality.     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    Patient Stated Goals Full ROM for my shoulder as painfree as possible and improve my stiffness and pain in my back    Currently in Pain? No/denies    Pain Score 0-No pain             Exercise/Activity Sets/Reps/Time/ Resistance Assistance Charge type Comments  MMT shoulder: 5/5 bilaterally with flexion, abduction, IR,  ER       Thoracis rotation: equal ROM bilaterally, some subjective tightness noted on R Side with L rotation but improved with stretching.       FOTO: 83.4 Goal: 64                                                      Treatment Provided this session   Rationale for Evaluation and Treatment Rehabilitation  Pt educated throughout session about proper posture and technique with exercises. Improved exercise technique, movement at target joints, use of target muscles after min to mod verbal, visual, tactile cues. Note: Portions of this document were prepared using Dragon voice recognition software and although reviewed may contain unintentional  dictation errors in syntax, grammar, or spelling.                              PT Short Term Goals - 09/15/21 0945       PT SHORT TERM GOAL #1   Title Pt will be independent with HEP in order to improve strength and decrease pain in order to improve pain-free function at home and work.    Baseline 07/27/2021- Patient presents with no formal HEP in place., 2/22: doing 2x a day    Time 6    Period Weeks    Status Achieved    Target Date 09/07/21               PT Long Term Goals - 12/22/21 0809       PT LONG TERM GOAL #1   Title Pt will improve FOTO to target score of 64 to display perceived improvements in ability to complete ADL's.    Baseline 07/27/2021=53, 2/22: 54% 10/25/21: 63, 5/3: 57% 5/31: 81    Time 8    Period Weeks    Status Achieved    Target Date 12/20/21      PT LONG TERM GOAL #2   Title Pt will decrease quick DASH score by at least 8% in order to demonstrate clinically significant reduction in disability.    Baseline 07/27/2021= 29.5 %, 2/22: 20.4%, 5/3: 9%    Time 12    Period Weeks    Status Achieved    Target Date 10/19/21      PT LONG TERM GOAL #3   Title Pt will decrease worst pain as reported on NPRS by at least 3 points in order to demonstrate clinically significant reduction in low back and left shoudler pain.    Baseline 07/27/2021= 7/10 reported LPB and left shoulder pain, 2/22: worst pain 3/10 in low back, 0.5/10 in shoulder, 2/10 LBP ( waking up and being sore) past week, 4/10 following golf workout in PT. 5/3: 10/10 back spasm on Saturday, 0/10 shoulder. 1/10 at worst over past week    Time 12    Period Weeks    Status Achieved    Target Date 12/20/21      PT LONG TERM GOAL #4   Title Pt will increase Left UE strength in shoulder abduction/flexion to 4/5 in order to demonstrate improvement in strength and function for lifting and carrying.    Baseline 07/27/2021= 2-/5 left shoulder flex/abd, 2/22: LUE: 4+ flexion, 3+ abduction,  4/5 IR; L UE: 4-/5, 5/3: see flowsheet 5/5 strength bilaterally.  Time 8    Period Weeks    Status On-going    Target Date 12/20/21      PT LONG TERM GOAL #5   Title Patient will exhibits full rotation ROM in thoracolumbar spine without pain after 10+ repetitions to demonstrate improvement in tolerance with functional/recreational activities such as return to golf and playing with son.    Baseline Pt has pain in low back following completing exercises such as this exercises, pt also has pain with perofrming 10 repetitions ( around rep 8) of TL rotation to simulate golf activities, 5/3: exhibits full motion, no pain;    Time 8    Period Weeks    Status Achieved    Target Date 12/20/21      PT LONG TERM GOAL #6   Title Patient will report ability to complete golfing activities or simulating golfing activities without significant pain and discomfort in the day to 2 days following in order to improve his ability to participate in recreational activities.    Baseline Significant pain following golf simulation activities in previous sessions. 5/3: hasn't been able to play due to weather. 5/31: Pt has not tried but feels confident in his ability.    Time 8    Period Weeks    Status Partially Met    Target Date 12/20/21      PT LONG TERM GOAL #7   Title Patient will demonstrate ability to lift 4 pound weight into and out of abduction of the left shoulder at 90 degrees in order to improve patient's ability to lift object such as his water bottle off of his desk as well as complete activities around his home such as putting away groceries and putting away dishes.    Baseline patient reports pain with this activity when lifting his water bottle with a weight of 3 to 5 pounds, 5/3: no pain lifting weight overhead    Time 8    Period Weeks    Status Achieved    Target Date 12/20/21                   Plan - 12/22/21 0827     Clinical Impression Statement Patient presents to physical  therapy for recertification note this session.  Patient's goals were assessed and patient has met or nearly met all of his physical therapy goals with the exception of playing golf which she has not attempted yet but feels confident he will be able to complete without significant pain discomfort following.  Patient is thoracic rotation is now equal bilaterally with some subjective feeling of tightness with left rotation that did improved with stretches.  Patient's focus on therapeutic outcomes goal has significantly improved indicating subjective improvement in function due to his current condition. Pt also demonstrates equal shoulder strength bilaterally with manual muscle testing this session.  Patient will be formally discharged to skilled physical therapy with home exercise program which she reports he is very comfortable with completing on his own and has no questions regarding home exercise program at this time.  Patient reports he has felt significant improvement from physical therapy interventions and feels that this therapy has prevented him from having both low back as well as shoulder surgical interventions.    Examination-Activity Limitations Bend;Carry;Lift;Stand;Squat;Reach Overhead    Examination-Participation Restrictions Cleaning;Community Activity;Yard Work    Stability/Clinical Decision Making Evolving/Moderate complexity    Rehab Potential Good    PT Frequency 2x / week    PT Duration --  1 week   PT Treatment/Interventions ADLs/Self Care Home Management;Cryotherapy;Electrical Stimulation;Moist Heat;Traction;Stair training;Gait training;Functional mobility training;Therapeutic activities;Therapeutic exercise;Balance training;Neuromuscular re-education;Manual techniques;Passive range of motion;Patient/family education;Dry needling;Spinal Manipulations;Joint Manipulations    PT Next Visit Plan Continue with low back strengthening, range of motion, manual therapy, as well as some left  shoulder strengthening activities to improve shoulder abduction on the left.    PT Home Exercise Plan Added prone shoulder flexion prone horizontal abduction and serratus anterior punches followed by extension and flexion of the shoulder.  08/25/2021=Access Code: [email or web address]  - Shoulder ER and ABD (resistive with TB); 2/20: Access Code: [email or web address]; 10/12/2021-Access Code: [email or web address]  URL: [email or web address]             Patient will benefit from skilled therapeutic intervention in order to improve the following deficits and impairments:  Decreased activity tolerance, Decreased endurance, Decreased strength, Decreased range of motion, Hypomobility, Impaired UE functional use, Pain  Visit Diagnosis: Chronic left shoulder pain  Chronic bilateral low back pain without sciatica     Problem List Patient Active Problem List   Diagnosis Date Noted   Shoulder pain, left 06/02/2021   Poison ivy dermatitis 04/01/2021   Family history of thyroid cancer 09/07/2020   Family history of prostate cancer in father 09/07/2020   Chronic midline low back pain without sciatica 07/07/2020   Acute knee pain 07/07/2020   Hyperlipidemia 09/05/2019   Other allergic rhinitis 06/03/2019   Coughing/wheezing 06/03/2019   Preventative health care 12/26/2018    Particia Lather, PT 12/22/2021, 8:38 AM  [place] [facility] [address] [place], [place], [address] Phone: [phone]   Fax:  [phone]  Name: Jesus Carroll MRN: [id number] Date of Birth: 1980-11-01

## 2021-12-27 ENCOUNTER — Ambulatory Visit: Payer: 59 | Admitting: Physical Therapy

## 2022-01-04 ENCOUNTER — Ambulatory Visit: Payer: 59

## 2022-01-04 ENCOUNTER — Ambulatory Visit: Payer: 59 | Admitting: Podiatry

## 2022-01-04 DIAGNOSIS — M659 Synovitis and tenosynovitis, unspecified: Secondary | ICD-10-CM | POA: Diagnosis not present

## 2022-01-04 DIAGNOSIS — M79671 Pain in right foot: Secondary | ICD-10-CM

## 2022-01-05 ENCOUNTER — Other Ambulatory Visit: Payer: Self-pay

## 2022-01-05 ENCOUNTER — Telehealth: Payer: Self-pay | Admitting: Podiatry

## 2022-01-05 MED ORDER — CYCLOBENZAPRINE HCL 10 MG PO TABS
10.0000 mg | ORAL_TABLET | Freq: Three times a day (TID) | ORAL | 0 refills | Status: AC | PRN
  Filled 2022-01-05: qty 30, 10d supply, fill #0

## 2022-01-05 NOTE — Telephone Encounter (Signed)
Pt called and was seen yesterday and was to have a medication called in and he is at the pharmacy and they do not have it. He goes to [facility]. Could you please send that asap.

## 2022-01-05 NOTE — Telephone Encounter (Signed)
Dr Posey Pronto had his assistant send it in and I notified pt that it was sent. HE said thank you.

## 2022-01-06 ENCOUNTER — Other Ambulatory Visit: Payer: Self-pay

## 2022-01-06 ENCOUNTER — Telehealth: Payer: Self-pay | Admitting: Podiatry

## 2022-01-06 MED ORDER — METHYLPREDNISOLONE 4 MG PO TBPK
ORAL_TABLET | ORAL | 0 refills | Status: DC
  Filled 2022-01-06: qty 21, 6d supply, fill #0

## 2022-01-06 NOTE — Progress Notes (Signed)
Subjective:  Patient ID: Jesus Carroll, male    DOB: Jan 05, 1981,  MRN: [id number]  No chief complaint on file.   41 y.o. male presents with the above complaint.  Patient presents with complaint of right foot middle and top of the foot pain that has been on for the past 3 weeks.  He recently went to [place] and did pretty good amount of walking.  He states it feels very tight and he is injured the top of the foot.  He states that he does not have any deep achy pain sometimes sharp shooting pain.  Pain scale is 6 out of 10.  It has been harder to ambulate.  He has not immobilized the foot he has not seen anyone as prior to seeing me.  He denies any other acute complaints.   Review of Systems: Negative except as noted in the HPI. Denies N/V/F/Ch.  Past Medical History:  Diagnosis Date   Allergic conjunctivitis 06/03/2019   Coughing/wheezing 06/03/2019    Current Outpatient Medications:    albuterol (VENTOLIN HFA) 108 (90 Base) MCG/ACT inhaler, Inhale 2 puffs into the lungs every 6 (six) hours as needed for wheezing or shortness of breath., Disp: 8.5 g, Rfl: 0   benzonatate (TESSALON) 100 MG capsule, Take 1 capsule (100 mg total) by mouth 3 (three) times daily as needed for cough., Disp: 30 capsule, Rfl: 0   cyclobenzaprine (FLEXERIL) 10 MG tablet, Take 1 tablet (10 mg total) by mouth 3 (three) times daily as needed for muscle spasms., Disp: 30 tablet, Rfl: 0   doxycycline (VIBRA-TABS) 100 MG tablet, Take 1 tablet (100 mg total) by mouth 2 (two) times daily., Disp: 14 tablet, Rfl: 0   triamcinolone (KENALOG) 0.025 % cream, Apply to affected area twice a day as needed, Disp: 15 g, Rfl: 0  Social History   Tobacco Use  Smoking Status Former  Smokeless Tobacco Never    No Known Allergies Objective:  There were no vitals filed for this visit. There is no height or weight on file to calculate BMI. Constitutional Well developed. Well nourished.  Vascular Dorsalis pedis pulses palpable  bilaterally. Posterior tibial pulses palpable bilaterally. Capillary refill normal to all digits.  No cyanosis or clubbing noted. Pedal hair growth normal.  Neurologic Normal speech. Oriented to person, place, and time. Epicritic sensation to light touch grossly present bilaterally.  Dermatologic Nails well groomed and normal in appearance. No open wounds. No skin lesions.  Orthopedic: Pain on palpation with resisted dorsiflexion of the digi primarily at the hallucis longus tendon.  No pain at the digitorum longus.  t no pain with resisted plantarflexion of the digits.  Pain along the course of the extensor tendon.  No pain at the Lisfranc interval no pain with range of motion of the metatarsophalangeal joint.  Negative Mulder's click or neuroma symptoms   Radiographs: 3 views of skeletally mature adult right foot: No fracture noted.  Mild midfoot arthritis noted.  No other bony abnormalities identified. Assessment:   1. Right foot pain   2. Tenosynovitis of extensor hallucis longus tendon    Plan:  Patient was evaluated and treated and all questions answered.  Right extensor hallucis longus tendinitis -All questions and concerns were discussed with the patient in extensive detail.  Given the tendinitis that is present I believe patient will be benefit from immobilization with a cam boot.  Cam boot was dispensed.  If there is no improvement we will discuss steroid injection versus MRI during next  medical visit.  No follow-ups on file.

## 2022-01-06 NOTE — Telephone Encounter (Signed)
Patient called and stated that he was suppose to receive steroids at his pharmacy yesterday and he received muscle relaxer's.   Please advise

## 2022-01-11 ENCOUNTER — Ambulatory Visit: Payer: 59

## 2022-01-17 ENCOUNTER — Telehealth: Payer: Self-pay | Admitting: Podiatry

## 2022-01-17 ENCOUNTER — Ambulatory Visit: Payer: 59 | Admitting: Physical Therapy

## 2022-01-24 ENCOUNTER — Ambulatory Visit: Payer: 59 | Admitting: Physical Therapy

## 2022-01-31 ENCOUNTER — Ambulatory Visit: Payer: 59 | Admitting: Physical Therapy

## 2022-02-07 ENCOUNTER — Ambulatory Visit: Payer: 59 | Admitting: Physical Therapy

## 2022-02-08 ENCOUNTER — Ambulatory Visit: Payer: 59 | Admitting: Podiatry

## 2022-02-08 ENCOUNTER — Encounter: Payer: Self-pay | Admitting: Podiatry

## 2022-02-08 DIAGNOSIS — M659 Synovitis and tenosynovitis, unspecified: Secondary | ICD-10-CM | POA: Diagnosis not present

## 2022-02-08 NOTE — Progress Notes (Signed)
Subjective:  Patient ID: Jesus Carroll, male    DOB: [birth year],  MRN: [id number]  Chief Complaint  Patient presents with   Foot Pain    "It's doing pretty good.  When I have to stand up and walk all day, it bothers me."    41 y.o. male presents with the above complaint.  Patient presents for follow-up to right extensor tendinitis.  He states he is doing a lot better the cam boot helped considerably.  He like to discuss next treatment plan.  He does not have any pain anymore.  Pain scale 0 out of 10   Review of Systems: Negative except as noted in the HPI. Denies N/V/F/Ch.  Past Medical History:  Diagnosis Date   Allergic conjunctivitis 06/03/2019   Coughing/wheezing 06/03/2019    Current Outpatient Medications:    albuterol (VENTOLIN HFA) 108 (90 Base) MCG/ACT inhaler, Inhale 2 puffs into the lungs every 6 (six) hours as needed for wheezing or shortness of breath., Disp: 8.5 g, Rfl: 0   benzonatate (TESSALON) 100 MG capsule, Take 1 capsule (100 mg total) by mouth 3 (three) times daily as needed for cough., Disp: 30 capsule, Rfl: 0   cyclobenzaprine (FLEXERIL) 10 MG tablet, Take 1 tablet (10 mg total) by mouth 3 (three) times daily as needed for muscle spasms., Disp: 30 tablet, Rfl: 0   doxycycline (VIBRA-TABS) 100 MG tablet, Take 1 tablet (100 mg total) by mouth 2 (two) times daily., Disp: 14 tablet, Rfl: 0   methylPREDNISolone (MEDROL DOSEPAK) 4 MG TBPK tablet, Take as directed, Disp: 21 each, Rfl: 0   triamcinolone (KENALOG) 0.025 % cream, Apply to affected area twice a day as needed, Disp: 15 g, Rfl: 0  Social History   Tobacco Use  Smoking Status Former  Smokeless Tobacco Never    No Known Allergies Objective:  There were no vitals filed for this visit. There is no height or weight on file to calculate BMI. Constitutional Well developed. Well nourished.  Vascular Dorsalis pedis pulses palpable bilaterally. Posterior tibial pulses palpable bilaterally. Capillary  refill normal to all digits.  No cyanosis or clubbing noted. Pedal hair growth normal.  Neurologic Normal speech. Oriented to person, place, and time. Epicritic sensation to light touch grossly present bilaterally.  Dermatologic Nails well groomed and normal in appearance. No open wounds. No skin lesions.  Orthopedic: No further pain on palpation with resisted dorsiflexion of the digi primarily at the hallucis longus tendon.  No pain at the digitorum longus.  t no pain with resisted plantarflexion of the digits.  No further pain along the course of the extensor tendon.  No pain at the Lisfranc interval no pain with range of motion of the metatarsophalangeal joint.  Negative Mulder's click or neuroma symptoms   Radiographs: 3 views of skeletally mature adult right foot: No fracture noted.  Mild midfoot arthritis noted.  No other bony abnormalities identified. Assessment:   1. Tenosynovitis of extensor hallucis longus tendon     Plan:  Patient was evaluated and treated and all questions answered.  Right extensor hallucis longus tendinitis -Clinically healed and is doing a lot better.  At this time I discussed shoe gear modification as well as transition process.  I will have him come out of the boot and into regular shoes with a Tri-Lock ankle brace.  Tri-Lock ankle brace was dispensed.  If any foot and ankle issues on future of asked her to come back and see me.  He states understanding  No follow-ups on file.

## 2022-02-09 ENCOUNTER — Ambulatory Visit (INDEPENDENT_AMBULATORY_CARE_PROVIDER_SITE_OTHER): Payer: 59 | Admitting: Primary Care

## 2022-02-09 ENCOUNTER — Encounter: Payer: Self-pay | Admitting: Primary Care

## 2022-02-09 VITALS — BP 110/82 | HR 62 | Temp 98.6°F | Ht 68.0 in | Wt 201.0 lb

## 2022-02-09 DIAGNOSIS — J4520 Mild intermittent asthma, uncomplicated: Secondary | ICD-10-CM | POA: Diagnosis not present

## 2022-02-09 DIAGNOSIS — G8929 Other chronic pain: Secondary | ICD-10-CM

## 2022-02-09 DIAGNOSIS — M25512 Pain in left shoulder: Secondary | ICD-10-CM

## 2022-02-09 DIAGNOSIS — M545 Low back pain, unspecified: Secondary | ICD-10-CM | POA: Diagnosis not present

## 2022-02-09 DIAGNOSIS — E785 Hyperlipidemia, unspecified: Secondary | ICD-10-CM

## 2022-02-09 DIAGNOSIS — Z8042 Family history of malignant neoplasm of prostate: Secondary | ICD-10-CM | POA: Diagnosis not present

## 2022-02-09 DIAGNOSIS — Z0000 Encounter for general adult medical examination without abnormal findings: Secondary | ICD-10-CM | POA: Diagnosis not present

## 2022-02-09 NOTE — Assessment & Plan Note (Signed)
Improved and resolved after physical therapy

## 2022-02-09 NOTE — Progress Notes (Signed)
Subjective:    Patient ID: Jesus Carroll, male    DOB: [birth year], 41 y.o.   MRN: [id number]  HPI  Jesus Carroll is a very pleasant 41 y.o. male who presents today for complete physical and follow up of chronic conditions.  Immunizations: -Tetanus: 2019 -Influenza: Completed last season  -Covid-19: 3 vaccines   Diet: Fair diet.  Exercise: No regular exercise.  Eye exam: Completed years ago.  Dental exam: Completes semi-annually   BP Readings from Last 3 Encounters:  02/09/22 110/82  07/27/21 110/82  06/02/21 118/80        Review of Systems  Constitutional:  Negative for unexpected weight change.  HENT:  Negative for rhinorrhea.   Eyes:  Negative for visual disturbance.  Respiratory:  Negative for cough and shortness of breath.   Cardiovascular:  Negative for chest pain.  Gastrointestinal:  Negative for constipation and diarrhea.  Genitourinary:  Negative for difficulty urinating.  Musculoskeletal:  Positive for arthralgias. Negative for myalgias.  Skin:  Negative for rash.  Allergic/Immunologic: Negative for environmental allergies.  Neurological:  Negative for dizziness and headaches.  Psychiatric/Behavioral:  The patient is not nervous/anxious.          Past Medical History:  Diagnosis Date   Allergic conjunctivitis 06/03/2019   Coughing/wheezing 06/03/2019   Poison ivy dermatitis 04/01/2021    Social History   Socioeconomic History   Marital status: Married    Spouse name: Not on file   Number of children: Not on file   Years of education: Not on file   Highest education level: Not on file  Occupational History   Not on file  Tobacco Use   Smoking status: Former   Smokeless tobacco: Never  Vaping Use   Vaping Use: Never used  Substance and Sexual Activity   Alcohol use: Yes    Comment: TWICE A WEEK   Drug use: Never   Sexual activity: Not on file  Other Topics Concern   Not on file  Social History Narrative   Works as a Financial risk analyst.    Married.   1 child.   Enjoys fishing, going to the [employer] of [employer] investment banker Strain: Not on file  Food Insecurity: Not on file  Transportation Needs: Not on file  Physical Activity: Not on file  Stress: Not on file  Social Connections: Not on file  Intimate Partner Violence: Not on file    Past Surgical History:  Procedure Laterality Date   MICRODISCECTOMY LUMBAR  2006    Family History  Problem Relation Age of Onset   Breast cancer Mother    Skin cancer Mother    Allergic rhinitis Mother    Prostate cancer Father 56   Thyroid cancer Father    Allergic rhinitis Father    Lung cancer Maternal Grandfather    Cancer Paternal Grandfather        Unclear   Eczema Sister    Angioedema Neg Hx    Asthma Neg Hx    Atopy Neg Hx    Immunodeficiency Neg Hx    Urticaria Neg Hx     No Known Allergies  Current Outpatient Medications on File Prior to Visit  Medication Sig Dispense Refill   albuterol (VENTOLIN HFA) 108 (90 Base) MCG/ACT inhaler Inhale 2 puffs into the lungs every 6 (six) hours as needed for wheezing or shortness of breath. 8.5 g 0   cyclobenzaprine (FLEXERIL) 10 MG tablet Take 1 tablet (  10 mg total) by mouth 3 (three) times daily as needed for muscle spasms. 30 tablet 0   ibuprofen (ADVIL) 600 MG tablet Take 600 mg by mouth every 8 (eight) hours.     No current facility-administered medications on file prior to visit.    BP 110/82   Pulse 62   Temp 98.6 F (37 C) (Oral)   Ht 5\' 8"  (1.727 m)   Wt 201 lb (91.2 kg)   SpO2 96%   BMI 30.56 kg/m  Objective:   Physical Exam HENT:     Right Ear: Tympanic membrane and ear canal normal.     Left Ear: Tympanic membrane and ear canal normal.     Nose: Nose normal.     Right Sinus: No maxillary sinus tenderness or frontal sinus tenderness.     Left Sinus: No maxillary sinus tenderness or frontal sinus tenderness.  Eyes:     Conjunctiva/sclera: Conjunctivae normal.  Neck:      Thyroid: No thyromegaly.     Vascular: No carotid bruit.  Cardiovascular:     Rate and Rhythm: Normal rate and regular rhythm.     Heart sounds: Normal heart sounds.  Pulmonary:     Effort: Pulmonary effort is normal.     Breath sounds: Normal breath sounds. No wheezing or rales.  Abdominal:     General: Bowel sounds are normal.     Palpations: Abdomen is soft.     Tenderness: There is no abdominal tenderness.  Musculoskeletal:        General: Normal range of motion.     Cervical back: Neck supple.  Skin:    General: Skin is warm and dry.  Neurological:     Mental Status: He is alert and oriented to person, place, and time.     Cranial Nerves: No cranial nerve deficit.     Deep Tendon Reflexes: Reflexes are normal and symmetric.  Psychiatric:        Mood and Affect: Mood normal.           Assessment & Plan:   Problem List Items Addressed This Visit       Respiratory   Coughing/wheezing    Stable.  Continue albuterol PRN for which he uses sparingly.         Other   Preventative health care    Immunizations UTD.  Discussed the importance of a healthy diet and regular exercise in order for weight loss, and to reduce the risk of further co-morbidity.  Exam stable. Declines labs today.  Follow up in 1 year for repeat physical.       Hyperlipidemia    Improved from last year. He declines labs today.  Discussed the importance of a healthy diet and regular exercise in order for weight loss, and to reduce the risk of further co-morbidity.       Chronic midline low back pain without sciatica    Improved since completion of PT.  Continue home stretching.  Continue cyclobenzaprine 10 mg PRN.  Uses sparingly during warmer months.       Relevant Medications   ibuprofen (ADVIL) 600 MG tablet   Family history of prostate cancer in father    PSA reviewed from 2022 and prior years. We will defer repeat PSA until next year.      Shoulder pain, left     Improved and resolved after physical therapy          2023, NP

## 2022-02-09 NOTE — Assessment & Plan Note (Addendum)
Improved since completion of PT.  Continue home stretching.  Continue cyclobenzaprine 10 mg PRN.  Uses sparingly during warmer months.

## 2022-02-09 NOTE — Assessment & Plan Note (Signed)
Stable.  Continue albuterol PRN for which he uses sparingly.

## 2022-02-09 NOTE — Assessment & Plan Note (Signed)
PSA reviewed from 2022 and prior years. We will defer repeat PSA until next year.

## 2022-02-09 NOTE — Patient Instructions (Signed)
It was a pleasure to see you today!  Preventive Care 43-43 Years Old, Male Preventive care refers to lifestyle choices and visits with your health care provider that can promote health and wellness. Preventive care visits are also called wellness exams. What can I expect for my preventive care visit? Counseling During your preventive care visit, your health care provider may ask about your: Medical history, including: Past medical problems. Family medical history. Current health, including: Emotional well-being. Home life and relationship well-being. Sexual activity. Lifestyle, including: Alcohol, nicotine or tobacco, and drug use. Access to firearms. Diet, exercise, and sleep habits. Safety issues such as seatbelt and bike helmet use. Sunscreen use. Work and work environment. Physical exam Your health care provider will check your: Height and weight. These may be used to calculate your BMI (body mass index). BMI is a measurement that tells if you are at a healthy weight. Waist circumference. This measures the distance around your waistline. This measurement also tells if you are at a healthy weight and may help predict your risk of certain diseases, such as type 2 diabetes and high blood pressure. Heart rate and blood pressure. Body temperature. Skin for abnormal spots. What immunizations do I need?  Vaccines are usually given at various ages, according to a schedule. Your health care provider will recommend vaccines for you based on your age, medical history, and lifestyle or other factors, such as travel or where you work. What tests do I need? Screening Your health care provider may recommend screening tests for certain conditions. This may include: Lipid and cholesterol levels. Diabetes screening. This is done by checking your blood sugar (glucose) after you have not eaten for a while (fasting). Hepatitis B test. Hepatitis C test. HIV (human immunodeficiency virus)  test. STI (sexually transmitted infection) testing, if you are at risk. Lung cancer screening. Prostate cancer screening. Colorectal cancer screening. Talk with your health care provider about your test results, treatment options, and if necessary, the need for more tests. Follow these instructions at home: Eating and drinking  Eat a diet that includes fresh fruits and vegetables, whole grains, lean protein, and low-fat dairy products. Take vitamin and mineral supplements as recommended by your health care provider. Do not drink alcohol if your health care provider tells you not to drink. If you drink alcohol: Limit how much you have to 0-2 drinks a day. Know how much alcohol is in your drink. In the [place] one drink equals one 12 oz bottle of beer (355 mL), one 5 oz glass of wine (148 mL), or one 1 oz glass of hard liquor (44 mL). Lifestyle Brush your teeth every morning and night with fluoride toothpaste. Floss one time each day. Exercise for at least 30 minutes 5 or more days each week. Do not use any products that contain nicotine or tobacco. These products include cigarettes, chewing tobacco, and vaping devices, such as e-cigarettes. If you need help quitting, ask your health care provider. Do not use drugs. If you are sexually active, practice safe sex. Use a condom or other form of protection to prevent STIs. Take aspirin only as told by your health care provider. Make sure that you understand how much to take and what form to take. Work with your health care provider to find out whether it is safe and beneficial for you to take aspirin daily. Find healthy ways to manage stress, such as: Meditation, yoga, or listening to music. Journaling. Talking to a trusted person. Spending time with friends   and family. Minimize exposure to UV radiation to reduce your risk of skin cancer. Safety Always wear your seat belt while driving or riding in a vehicle. Do not drive: If you have been  drinking alcohol. Do not ride with someone who has been drinking. When you are tired or distracted. While texting. If you have been using any mind-altering substances or drugs. Wear a helmet and other protective equipment during sports activities. If you have firearms in your house, make sure you follow all gun safety procedures. What's next? Go to your health care provider once a year for an annual wellness visit. Ask your health care provider how often you should have your eyes and teeth checked. Stay up to date on all vaccines. This information is not intended to replace advice given to you by your health care provider. Make sure you discuss any questions you have with your health care provider. Document Revised: 01/06/2021 Document Reviewed: 01/06/2021 Elsevier Patient Education  2023 Elsevier Inc.  

## 2022-02-09 NOTE — Assessment & Plan Note (Signed)
Improved from last year. He declines labs today.  Discussed the importance of a healthy diet and regular exercise in order for weight loss, and to reduce the risk of further co-morbidity.

## 2022-02-09 NOTE — Assessment & Plan Note (Signed)
Immunizations UTD.  Discussed the importance of a healthy diet and regular exercise in order for weight loss, and to reduce the risk of further co-morbidity.  Exam stable. Declines labs today.  Follow up in 1 year for repeat physical.

## 2022-02-14 ENCOUNTER — Ambulatory Visit: Payer: 59

## 2022-02-21 ENCOUNTER — Ambulatory Visit: Payer: 59 | Admitting: Physical Therapy

## 2022-02-28 ENCOUNTER — Ambulatory Visit: Payer: 59 | Admitting: Physical Therapy

## 2022-03-01 ENCOUNTER — Other Ambulatory Visit: Payer: Self-pay

## 2022-03-01 DIAGNOSIS — M79641 Pain in right hand: Secondary | ICD-10-CM | POA: Diagnosis not present

## 2022-03-01 MED ORDER — METHYLPREDNISOLONE 4 MG PO TBPK
ORAL_TABLET | ORAL | 0 refills | Status: DC
  Filled 2022-03-01: qty 21, 6d supply, fill #0

## 2022-03-07 ENCOUNTER — Ambulatory Visit: Payer: 59 | Admitting: Physical Therapy

## 2022-03-14 ENCOUNTER — Ambulatory Visit: Payer: 59 | Admitting: Physical Therapy

## 2022-03-21 ENCOUNTER — Ambulatory Visit: Payer: 59 | Admitting: Physical Therapy

## 2022-03-30 ENCOUNTER — Ambulatory Visit: Payer: 59 | Admitting: Physical Therapy

## 2022-05-30 ENCOUNTER — Encounter: Payer: Self-pay | Admitting: Primary Care

## 2022-05-30 ENCOUNTER — Other Ambulatory Visit: Payer: Self-pay

## 2022-05-30 ENCOUNTER — Ambulatory Visit: Payer: 59 | Admitting: Primary Care

## 2022-05-30 VITALS — BP 126/74 | HR 76 | Temp 98.6°F | Ht 68.0 in | Wt 206.0 lb

## 2022-05-30 DIAGNOSIS — L291 Pruritus scroti: Secondary | ICD-10-CM

## 2022-05-30 MED ORDER — TRIAMCINOLONE ACETONIDE 0.5 % EX CREA
1.0000 | TOPICAL_CREAM | Freq: Two times a day (BID) | CUTANEOUS | 0 refills | Status: DC
  Filled 2022-05-30: qty 30, 15d supply, fill #0

## 2022-05-30 NOTE — Progress Notes (Signed)
Subjective:    Patient ID: Jesus Carroll, male    DOB: 03/02/1981, 41 y.o.   MRN: [id number]  HPI  Jesus Carroll is a very pleasant 41 y.o. male with a history of allergic rhinitis, chronic back pain who presents today to discuss itching.  His itching is located to the scrotum for which began 1-2 years ago. His itching occurs sporadically, no pattern to his itching. He does notice whitish discoloration with scaling. Towards the end of the day he will notice flaking in his underwear from the dryness. He will scratch to the point where he will cause irritation to the skin. Over the last 3-4 months his itching has become worse.   He's tried CeraVe without improvement. He denies itching or rashes elsewhere to his body. He denies changes in any soaps/detergents/clothing/medications.    Review of Systems  Genitourinary:  Negative for penile pain, scrotal swelling and testicular pain.  Skin:  Positive for color change and rash.         Past Medical History:  Diagnosis Date   Allergic conjunctivitis 06/03/2019   Coughing/wheezing 06/03/2019   Poison ivy dermatitis 04/01/2021    Social History   Socioeconomic History   Marital status: Married    Spouse name: Not on file   Number of children: Not on file   Years of education: Not on file   Highest education level: Not on file  Occupational History   Not on file  Tobacco Use   Smoking status: Former   Smokeless tobacco: Never  Vaping Use   Vaping Use: Never used  Substance and Sexual Activity   Alcohol use: Yes    Comment: TWICE A WEEK   Drug use: Never   Sexual activity: Not on file  Other Topics Concern   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   Enjoys fishing, going to the [employer] of [employer] investment banker Strain: Not on file  Food Insecurity: Not on file  Transportation Needs: Not on file  Physical Activity: Not on file  Stress: Not  on file  Social Connections: Not on file  Intimate Partner Violence: Not on file    Past Surgical History:  Procedure Laterality Date   MICRODISCECTOMY LUMBAR  2006    Family History  Problem Relation Age of Onset   Breast cancer Mother    Skin cancer Mother    Allergic rhinitis Mother    Prostate cancer Father 32   Thyroid cancer Father    Allergic rhinitis Father    Lung cancer Maternal Grandfather    Cancer Paternal Grandfather        Unclear   Eczema Sister    Angioedema Neg Hx    Asthma Neg Hx    Atopy Neg Hx    Immunodeficiency Neg Hx    Urticaria Neg Hx     No Known Allergies  Current Outpatient Medications on File Prior to Visit  Medication Sig Dispense Refill   cyclobenzaprine (FLEXERIL) 10 MG tablet Take 1 tablet (10 mg total) by mouth 3 (three) times daily as needed for muscle spasms. 30 tablet 0   ibuprofen (ADVIL) 600 MG tablet Take 600 mg by mouth every 8 (eight) hours.     albuterol (VENTOLIN HFA) 108 (90 Base) MCG/ACT inhaler Inhale 2 puffs into the lungs every 6 (six) hours as needed for wheezing or shortness of  breath. (Patient not taking: Reported on 05/30/2022) 8.5 g 0   No current facility-administered medications on file prior to visit.    BP 126/74   Pulse 76   Temp 98.6 F (37 C) (Temporal)   Ht 5\' 8"  (1.727 m)   Wt 206 lb (93.4 kg)   SpO2 98%   BMI 31.32 kg/m  Objective:   Physical Exam Exam conducted with a chaperone present.  Constitutional:      General: He is not in acute distress. Pulmonary:     Effort: Pulmonary effort is normal.  Genitourinary:    Comments: See Skin assessment notes. Skin:    General: Skin is warm and dry.     Findings: Erythema present.     Comments: Scrotum moderately erythematous, especially to the underside. Slight skin breakdown to underside of scrotum. No penile abnormalities.   No scaling or dry/flaky skin noted.           Assessment & Plan:   Problem List Items Addressed This Visit        Musculoskeletal and Integument   Scrotal itching - Primary    Unclear etiology, could be some form of eczema given his reports of scaling. Rx for triamcinolone 0.5% cream sent to pharmacy to try BID x 1 week. He will update at that time.        Relevant Medications   triamcinolone cream (KENALOG) 0.5 %       Pleas Koch, NP

## 2022-05-30 NOTE — Assessment & Plan Note (Signed)
Unclear etiology, could be some form of eczema given his reports of scaling. Rx for triamcinolone 0.5% cream sent to pharmacy to try BID x 1 week. He will update at that time.

## 2022-05-30 NOTE — Patient Instructions (Signed)
You can apply the triamcinolone cream twice daily for one week, then as needed.  Please update me regarding your itching.  It was a pleasure to see you today!

## 2022-05-31 ENCOUNTER — Other Ambulatory Visit: Payer: Self-pay

## 2022-10-13 ENCOUNTER — Encounter: Payer: Self-pay | Admitting: Primary Care

## 2022-10-13 ENCOUNTER — Other Ambulatory Visit: Payer: Self-pay

## 2022-10-13 ENCOUNTER — Telehealth (INDEPENDENT_AMBULATORY_CARE_PROVIDER_SITE_OTHER): Payer: Commercial Managed Care - PPO | Admitting: Primary Care

## 2022-10-13 ENCOUNTER — Ambulatory Visit (INDEPENDENT_AMBULATORY_CARE_PROVIDER_SITE_OTHER)
Admission: RE | Admit: 2022-10-13 | Discharge: 2022-10-13 | Disposition: A | Discharge status: Home / Self Care | Payer: Commercial Managed Care - PPO | Source: Ambulatory Visit | Attending: Primary Care | Admitting: Primary Care

## 2022-10-13 VITALS — Ht 68.0 in | Wt 206.0 lb

## 2022-10-13 DIAGNOSIS — R051 Acute cough: Secondary | ICD-10-CM | POA: Diagnosis not present

## 2022-10-13 DIAGNOSIS — R059 Cough, unspecified: Secondary | ICD-10-CM | POA: Diagnosis not present

## 2022-10-13 MED ORDER — AMOXICILLIN-POT CLAVULANATE 875-125 MG PO TABS
1.0000 | ORAL_TABLET | Freq: Two times a day (BID) | ORAL | 0 refills | Status: DC
  Filled 2022-10-13: qty 14, 7d supply, fill #0

## 2022-10-13 NOTE — Patient Instructions (Signed)
Start Augmentin antibiotics. Take 1 tablet by mouth twice daily for 7 days.  Come by for the chest xray.  It was a pleasure to see you today!

## 2022-10-13 NOTE — Progress Notes (Signed)
Patient ID: DIX HOVEL, male    DOB: [birth year], 42 y.o.   MRN: [id number]  Virtual visit completed through [place], a video enabled telemedicine application. Due to national recommendations of social distancing due to COVID-19, a virtual visit is felt to be most appropriate for this patient at this time. Reviewed limitations, risks, security and privacy concerns of performing a virtual visit and the availability of in person appointments. I also reviewed that there may be a patient responsible charge related to this service. The patient agreed to proceed.   Patient location: home Provider location: [place] at [facility], office Persons participating in this virtual visit: patient, provider   If any vitals were documented, they were collected by patient at home unless specified below.    Ht 5\' 8"  (1.727 m)   Wt 206 lb (93.4 kg)   BMI 31.32 kg/m    CC: Cough Subjective:   HPI: Jesus Carroll is a 42 y.o. male with a history of allergic rhinitis, cough/wheezing presenting on 10/13/2022 for Cough (X1 mo coughing up phlegm )  Symptom onset 3-4 weeks ago with a tickle in his throat, post nasal drip. He then developed a cough which was persistent, sore throat, rhinorrhea. Over the last week he's developed a deeper and more persistent cough with wheezing.   He's been taking Mucinex and cough drops without improvement. Overall he feels fine, but he does experience moderate shortness of breath with mild to moderate exertion.   His son was sick with the same symptoms, diagnosed with "walking pneumonia".       Relevant past medical, surgical, family and social history reviewed and updated as indicated. Interim medical history since our last visit reviewed. Allergies and medications reviewed and updated. Outpatient Medications Prior to Visit  Medication Sig Dispense Refill   albuterol (VENTOLIN HFA) 108 (90 Base) MCG/ACT inhaler Inhale 2 puffs into the lungs every 6 (six) hours as  needed for wheezing or shortness of breath. 8.5 g 0   cyclobenzaprine (FLEXERIL) 10 MG tablet Take 1 tablet (10 mg total) by mouth 3 (three) times daily as needed for muscle spasms. 30 tablet 0   ibuprofen (ADVIL) 600 MG tablet Take 600 mg by mouth every 8 (eight) hours.     triamcinolone cream (KENALOG) 0.5 % Apply 1 Application topically 2 (two) times daily. (Patient not taking: Reported on 10/13/2022) 30 g 0   No facility-administered medications prior to visit.     Per HPI unless specifically indicated in ROS section below Review of Systems  Constitutional:  Negative for chills, fatigue and fever.  HENT:  Positive for postnasal drip. Negative for congestion.   Respiratory:  Positive for cough and shortness of breath.   Cardiovascular:  Negative for chest pain.  Allergic/Immunologic: Positive for environmental allergies.  Neurological:  Negative for headaches.   Objective:  Ht 5\' 8"  (1.727 m)   Wt 206 lb (93.4 kg)   BMI 31.32 kg/m   Wt Readings from Last 3 Encounters:  10/13/22 206 lb (93.4 kg)  05/30/22 206 lb (93.4 kg)  02/09/22 201 lb (91.2 kg)       Physical exam: General: Alert and oriented x 3, no distress, does not appear sickly  Pulmonary: Speaks in complete sentences without increased work of breathing, deep cough noted a few times during visit.  Psychiatric: Normal mood, thought content, and behavior.     Results for orders placed or performed in visit on 09/07/20  Hepatitis C antibody  Result  Value Ref Range   Hepatitis C Ab NON-REACTIVE NON-REACTI   SIGNAL TO CUT-OFF 0.01 <1.00  HIV Antibody (routine testing w rflx)  Result Value Ref Range   HIV 1&2 Ab, 4th Generation NON-REACTIVE NON-REACTI  TSH  Result Value Ref Range   TSH 1.29 0.35 - 4.50 uIU/mL  Lipid panel  Result Value Ref Range   Cholesterol 173 0 - 200 mg/dL   Triglycerides 153.0 (H) 0.0 - 149.0 mg/dL   HDL 34.20 (L) >39.00 mg/dL   VLDL 30.6 0.0 - 40.0 mg/dL   LDL Cholesterol 108 (H) 0 - 99  mg/dL   Total CHOL/HDL Ratio 5    NonHDL 138.36   Comprehensive metabolic panel  Result Value Ref Range   Sodium 141 135 - 145 mEq/L   Potassium 4.4 3.5 - 5.1 mEq/L   Chloride 104 96 - 112 mEq/L   CO2 29 19 - 32 mEq/L   Glucose, Bld 94 70 - 99 mg/dL   BUN 17 6 - 23 mg/dL   Creatinine, Ser 1.16 0.40 - 1.50 mg/dL   Total Bilirubin 0.5 0.2 - 1.2 mg/dL   Alkaline Phosphatase 84 39 - 117 U/L   AST 35 0 - 37 U/L   ALT 54 (H) 0 - 53 U/L   Total Protein 7.4 6.0 - 8.3 g/dL   Albumin 4.6 3.5 - 5.2 g/dL   GFR 78.12 >60.00 mL/min   Calcium 10.0 8.4 - 10.5 mg/dL  CBC  Result Value Ref Range   WBC 9.9 4.0 - 10.5 K/uL   RBC 5.32 4.22 - 5.81 Mil/uL   Platelets 218.0 150.0 - 400.0 K/uL   Hemoglobin 16.7 13.0 - 17.0 g/dL   HCT 48.4 39.0 - 52.0 %   MCV 91.0 78.0 - 100.0 fl   MCHC 34.5 30.0 - 36.0 g/dL   RDW 12.6 11.5 - 15.5 %  PSA  Result Value Ref Range   PSA 1.10 0.10 - 4.00 ng/mL   Assessment & Plan:   Problem List Items Addressed This Visit       Other   Acute cough - Primary    Could be post viral cough vs pneumonia.  Assessment is difficult given virtual platform.  Checking chest xray today.   Start Augmentin antibiotics. Take 1 tablet by mouth twice daily for 7 days. Consider omeprazole for potential reflux induced post infectious cough.   He will update early next week.        Relevant Medications   amoxicillin-clavulanate (AUGMENTIN) 875-125 MG tablet   Other Relevant Orders   DG Chest 2 View     Meds ordered this encounter  Medications   amoxicillin-clavulanate (AUGMENTIN) 875-125 MG tablet    Sig: Take 1 tablet by mouth 2 (two) times daily.    Dispense:  14 tablet    Refill:  0    Order Specific Question:   Supervising Provider    Answer:   Diona Browner, AMY E [[id number]]   Orders Placed This Encounter  Procedures   DG Chest 71 View    42 year old male with 3-4 week history of persistent cough. Please evaluate.    Standing Status:   Future    Standing Expiration  Date:   10/13/2023    Order Specific Question:   Reason for Exam (SYMPTOM  OR DIAGNOSIS REQUIRED)    Answer:   acute cough    Order Specific Question:   Radiology Contrast Protocol - do NOT remove file path    Answer:   \\  [email or web address]    Order Specific Question:   Preferred imaging location?    Answer:   [facility]    I discussed the assessment and treatment plan with the patient. The patient was provided an opportunity to ask questions and all were answered. The patient agreed with the plan and demonstrated an understanding of the instructions. The patient was advised to call back or seek an in-person evaluation if the symptoms worsen or if the condition fails to improve as anticipated.  Follow up plan:  Start Augmentin antibiotics. Take 1 tablet by mouth twice daily for 7 days.  Come by for the chest xray.  It was a pleasure to see you today!  Pleas Koch, NP

## 2022-10-13 NOTE — Assessment & Plan Note (Signed)
Could be post viral cough vs pneumonia.  Assessment is difficult given virtual platform.  Checking chest xray today.   Start Augmentin antibiotics. Take 1 tablet by mouth twice daily for 7 days. Consider omeprazole for potential reflux induced post infectious cough.   He will update early next week.

## 2022-10-14 ENCOUNTER — Other Ambulatory Visit: Payer: Self-pay

## 2022-10-18 ENCOUNTER — Telehealth: Payer: Self-pay | Admitting: *Deleted

## 2022-10-18 NOTE — Telephone Encounter (Signed)
[place] [employer]: A [employer] Study Patient Jesus Carroll is a potential candidate for the above listed study.  This Clinical Research Nurse spoke with ROYSTON WALLEN, [id number] over Webex 10/18/22 to discuss participation in the [facility] research study.  A copy of the informed consent document with embedded HIPAA Authorization language was previously provided to the patient.   [place] was encouraged to ask any questions they may have.  Candidate has not yet read the informed consent documents and so we initiated a review of the consent form document today.  Patient was provided the option of reviewing informed consent documents at their convenience.  Plan is to continue the consent conversation in the future at a time that is convenient for [place].   Doreatha Martin, RN, BSN, [facility] 10/18/2022 11:35 AM

## 2022-10-20 NOTE — Telephone Encounter (Signed)
[place] [facility]: [place] declines participation in [facility].  Candidate thanked for taking time to consider participation and updating this RN with his informed decision.  Doreatha Martin, RN, BSN, [facility] 10/20/2022 11:59 AM

## 2023-03-29 ENCOUNTER — Other Ambulatory Visit: Payer: Self-pay | Admitting: Primary Care

## 2023-03-29 DIAGNOSIS — L291 Pruritus scroti: Secondary | ICD-10-CM

## 2023-03-29 NOTE — Telephone Encounter (Signed)
Called and spoke with patient, he stated he did request the refill of triamcinolone for itching.  Scheduled CPE

## 2023-03-29 NOTE — Telephone Encounter (Signed)
Please call patient:  Received refill request for triamcinolone cream.  Did he initiate the refill request?  Also, he is due for CPE.  Please schedule.

## 2023-03-29 NOTE — Telephone Encounter (Signed)
Unable to reach patient. Left voicemail to return call to our office.   

## 2023-03-30 ENCOUNTER — Other Ambulatory Visit: Payer: Self-pay

## 2023-03-30 MED ORDER — TRIAMCINOLONE ACETONIDE 0.5 % EX CREA
1.0000 | TOPICAL_CREAM | Freq: Two times a day (BID) | CUTANEOUS | 0 refills | Status: AC
  Filled 2023-03-30: qty 30, 30d supply, fill #0

## 2023-03-30 NOTE — Telephone Encounter (Signed)
Refills sent to pharmacy. 

## 2023-04-25 ENCOUNTER — Encounter: Payer: Self-pay | Admitting: Primary Care

## 2023-04-25 ENCOUNTER — Ambulatory Visit (INDEPENDENT_AMBULATORY_CARE_PROVIDER_SITE_OTHER): Payer: Commercial Managed Care - PPO | Admitting: Primary Care

## 2023-04-25 VITALS — BP 114/68 | HR 69 | Temp 97.2°F | Ht 68.0 in | Wt 198.0 lb

## 2023-04-25 DIAGNOSIS — Z0000 Encounter for general adult medical examination without abnormal findings: Secondary | ICD-10-CM

## 2023-04-25 DIAGNOSIS — Z23 Encounter for immunization: Secondary | ICD-10-CM

## 2023-04-25 DIAGNOSIS — G8929 Other chronic pain: Secondary | ICD-10-CM

## 2023-04-25 DIAGNOSIS — E785 Hyperlipidemia, unspecified: Secondary | ICD-10-CM | POA: Diagnosis not present

## 2023-04-25 DIAGNOSIS — Z125 Encounter for screening for malignant neoplasm of prostate: Secondary | ICD-10-CM | POA: Diagnosis not present

## 2023-04-25 DIAGNOSIS — Z808 Family history of malignant neoplasm of other organs or systems: Secondary | ICD-10-CM

## 2023-04-25 DIAGNOSIS — Z8042 Family history of malignant neoplasm of prostate: Secondary | ICD-10-CM

## 2023-04-25 DIAGNOSIS — M545 Low back pain, unspecified: Secondary | ICD-10-CM | POA: Diagnosis not present

## 2023-04-25 DIAGNOSIS — L291 Pruritus scroti: Secondary | ICD-10-CM

## 2023-04-25 DIAGNOSIS — J3089 Other allergic rhinitis: Secondary | ICD-10-CM

## 2023-04-25 NOTE — Assessment & Plan Note (Signed)
Controlled.  Continue Xyzal and Flonase PRN.

## 2023-04-25 NOTE — Patient Instructions (Signed)
Stop by the lab prior to leaving today. I will notify you of your results once received.   Try clotrimazole antifungal cream for the itching as needed.  It was a pleasure to see you today!

## 2023-04-25 NOTE — Assessment & Plan Note (Signed)
Immunizations UTD. Influenza vaccine provided today.  PSA due and pending. Discussed the importance of a healthy diet and regular exercise in order for weight loss, and to reduce the risk of further co-morbidity.  Exam stable. Labs pending.  Follow up in 1 year for repeat physical.

## 2023-04-25 NOTE — Assessment & Plan Note (Signed)
Controlled.  Continue triamcinolone 0.5% ointment. Add clotrimazole OTC to start when symptoms return.

## 2023-04-25 NOTE — Progress Notes (Signed)
Subjective:    Patient ID: Jesus Carroll, male    DOB: 04-12-1981, 42 y.o.   MRN: [id number]  HPI  Jesus Carroll is a very pleasant 42 y.o. male who presents today for complete physical and follow up of chronic conditions.  Immunizations: -Tetanus: Completed in 2019 -Influenza: Influenza vaccine provided today.  Diet: Fair diet.  Exercise: No regular exercise. Active   Eye exam: Completed years ago  Dental exam: Completes semi-annually    BP Readings from Last 3 Encounters:  04/25/23 114/68  05/30/22 126/74  02/09/22 110/82         Review of Systems  Constitutional:  Negative for unexpected weight change.  HENT:  Negative for rhinorrhea.   Respiratory:  Negative for cough and shortness of breath.   Cardiovascular:  Negative for chest pain.  Gastrointestinal:  Negative for constipation and diarrhea.  Genitourinary:  Negative for difficulty urinating.  Musculoskeletal:  Negative for arthralgias and myalgias.  Skin:  Negative for rash.  Allergic/Immunologic: Negative for environmental allergies.  Neurological:  Negative for dizziness, numbness and headaches.  Psychiatric/Behavioral:  The patient is not nervous/anxious.          Past Medical History:  Diagnosis Date   Allergic conjunctivitis 06/03/2019   Coughing/wheezing 06/03/2019   Poison ivy dermatitis 04/01/2021    Social History   Socioeconomic History   Marital status: Married    Spouse name: Not on file   Number of children: Not on file   Years of education: Not on file   Highest education level: Not on file  Occupational History   Not on file  Tobacco Use   Smoking status: Former   Smokeless tobacco: Never  Vaping Use   Vaping status: Never Used  Substance and Sexual Activity   Alcohol use: Yes    Comment: TWICE A WEEK   Drug use: Never   Sexual activity: Not on file  Other Topics Concern   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    Enjoys fishing, going to the [employer] of [employer] investment banker Strain: Not on file  Food Insecurity: Not on file  Transportation Needs: Not on file  Physical Activity: Not on file  Stress: Not on file  Social Connections: Not on file  Intimate Partner Violence: Not on file    Past Surgical History:  Procedure Laterality Date   MICRODISCECTOMY LUMBAR  2006    Family History  Problem Relation Age of Onset   Breast cancer Mother    Skin cancer Mother    Allergic rhinitis Mother    Prostate cancer Father 10   Thyroid cancer Father    Allergic rhinitis Father    Lung cancer Maternal Grandfather    Cancer Paternal Grandfather        Unclear   Eczema Sister    Angioedema Neg Hx    Asthma Neg Hx    Atopy Neg Hx    Immunodeficiency Neg Hx    Urticaria Neg Hx     No Known Allergies  Current Outpatient Medications on File Prior to Visit  Medication Sig Dispense Refill   albuterol (VENTOLIN HFA) 108 (90 Base) MCG/ACT inhaler Inhale 2 puffs into the lungs every 6 (six) hours as needed for wheezing or shortness of breath. 8.5 g 0   cyclobenzaprine (FLEXERIL) 10 MG tablet Take 1 tablet (10 mg total) by mouth  3 (three) times daily as needed for muscle spasms. 30 tablet 0   ibuprofen (ADVIL) 600 MG tablet Take 600 mg by mouth every 8 (eight) hours.     triamcinolone cream (KENALOG) 0.5 % Apply 1 Application topically 2 (two) times daily. 30 g 0   No current facility-administered medications on file prior to visit.    BP 114/68   Pulse 69   Temp (!) 97.2 F (36.2 C) (Temporal)   Ht 5\' 8"  (1.727 m)   Wt 198 lb (89.8 kg)   SpO2 98%   BMI 30.11 kg/m  Objective:   Physical Exam HENT:     Right Ear: Tympanic membrane and ear canal normal.     Left Ear: Tympanic membrane and ear canal normal.  Eyes:     Pupils: Pupils are equal, round, and reactive to light.  Cardiovascular:     Rate and Rhythm: Normal rate and regular rhythm.  Pulmonary:      Effort: Pulmonary effort is normal.     Breath sounds: Normal breath sounds.  Abdominal:     General: Bowel sounds are normal.     Palpations: Abdomen is soft.     Tenderness: There is no abdominal tenderness.  Musculoskeletal:        General: Normal range of motion.     Cervical back: Neck supple.  Skin:    General: Skin is warm and dry.  Neurological:     Mental Status: He is alert and oriented to person, place, and time.     Cranial Nerves: No cranial nerve deficit.     Deep Tendon Reflexes:     Reflex Scores:      Patellar reflexes are 2+ on the right side and 2+ on the left side. Psychiatric:        Mood and Affect: Mood normal.           Assessment & Plan:  Preventative health care Assessment & Plan: Immunizations UTD. Influenza vaccine provided today.  PSA due and pending. Discussed the importance of a healthy diet and regular exercise in order for weight loss, and to reduce the risk of further co-morbidity.  Exam stable. Labs pending.  Follow up in 1 year for repeat physical.    Chronic midline low back pain without sciatica Assessment & Plan: Controlled.  Continue Flexeril 5 mg PRN.   Other allergic rhinitis Assessment & Plan: Controlled.  Continue Xyzal and Flonase PRN.   Scrotal itching Assessment & Plan: Controlled.  Continue triamcinolone 0.5% ointment. Add clotrimazole OTC to start when symptoms return.    Hyperlipidemia, unspecified hyperlipidemia type Assessment & Plan: Repeat lipid panel pending. Encouraged a healthy diet and regular exercise.  Orders: -     Lipid panel -     Comprehensive metabolic panel -     TSH  Family history of prostate cancer in father -     PSA  Screening for prostate cancer -     PSA  Family history of thyroid cancer Assessment & Plan: Repeat TSH pending.         Doreene Nest, NP

## 2023-04-25 NOTE — Assessment & Plan Note (Signed)
Repeat TSH pending

## 2023-04-25 NOTE — Assessment & Plan Note (Signed)
Repeat lipid panel pending.  Encouraged a healthy diet and regular exercise. 

## 2023-04-25 NOTE — Assessment & Plan Note (Signed)
Controlled.  Continue Flexeril 5 mg PRN.

## 2023-04-26 LAB — COMPREHENSIVE METABOLIC PANEL
ALT: 33 U/L (ref 0–53)
AST: 25 U/L (ref 0–37)
Albumin: 4.5 g/dL (ref 3.5–5.2)
Alkaline Phosphatase: 85 U/L (ref 39–117)
BUN: 14 mg/dL (ref 6–23)
CO2: 30 meq/L (ref 19–32)
Calcium: 10 mg/dL (ref 8.4–10.5)
Chloride: 104 meq/L (ref 96–112)
Creatinine, Ser: 1.2 mg/dL (ref 0.40–1.50)
GFR: 73.63 mL/min (ref >60.00)
Glucose, Bld: 72 mg/dL (ref 70–99)
Potassium: 4.5 meq/L (ref 3.5–5.1)
Sodium: 143 meq/L (ref 135–145)
Total Bilirubin: 0.6 mg/dL (ref 0.2–1.2)
Total Protein: 7.1 g/dL (ref 6.0–8.3)

## 2023-04-26 LAB — LIPID PANEL
Cholesterol: 224 mg/dL — ABNORMAL HIGH (ref 0–200)
HDL: 41.5 mg/dL (ref >39.00)
LDL Cholesterol: 136 mg/dL — ABNORMAL HIGH (ref 0–99)
NonHDL: 182.76
Total CHOL/HDL Ratio: 5
Triglycerides: 235 mg/dL — ABNORMAL HIGH (ref 0.0–149.0)
VLDL: 47 mg/dL — ABNORMAL HIGH (ref 0.0–40.0)

## 2023-04-26 LAB — TSH: TSH: 1.27 u[IU]/mL (ref 0.35–5.50)

## 2023-04-26 LAB — PSA: PSA: 1.6 ng/mL (ref 0.10–4.00)

## 2023-09-25 DIAGNOSIS — E785 Hyperlipidemia, unspecified: Secondary | ICD-10-CM | POA: Diagnosis not present

## 2023-09-25 DIAGNOSIS — G4719 Other hypersomnia: Secondary | ICD-10-CM | POA: Diagnosis not present

## 2023-09-25 DIAGNOSIS — E6609 Other obesity due to excess calories: Secondary | ICD-10-CM | POA: Diagnosis not present

## 2023-09-25 DIAGNOSIS — R0681 Apnea, not elsewhere classified: Secondary | ICD-10-CM | POA: Diagnosis not present

## 2023-09-25 DIAGNOSIS — E66811 Obesity, class 1: Secondary | ICD-10-CM | POA: Diagnosis not present

## 2023-09-25 DIAGNOSIS — R0683 Snoring: Secondary | ICD-10-CM | POA: Diagnosis not present

## 2023-09-25 DIAGNOSIS — Z6832 Body mass index (BMI) 32.0-32.9, adult: Secondary | ICD-10-CM | POA: Diagnosis not present

## 2023-10-11 DIAGNOSIS — G4733 Obstructive sleep apnea (adult) (pediatric): Secondary | ICD-10-CM | POA: Diagnosis not present

## 2023-10-19 DIAGNOSIS — G4733 Obstructive sleep apnea (adult) (pediatric): Secondary | ICD-10-CM | POA: Diagnosis not present

## 2023-10-20 ENCOUNTER — Other Ambulatory Visit (HOSPITAL_COMMUNITY): Payer: Self-pay

## 2023-11-09 DIAGNOSIS — G4733 Obstructive sleep apnea (adult) (pediatric): Secondary | ICD-10-CM | POA: Diagnosis not present

## 2023-12-09 DIAGNOSIS — G4733 Obstructive sleep apnea (adult) (pediatric): Secondary | ICD-10-CM | POA: Diagnosis not present

## 2024-01-09 DIAGNOSIS — G4733 Obstructive sleep apnea (adult) (pediatric): Secondary | ICD-10-CM | POA: Diagnosis not present

## 2024-01-17 ENCOUNTER — Other Ambulatory Visit: Payer: Self-pay

## 2024-01-17 MED ORDER — SCOPOLAMINE 1 MG/3DAYS TD PT72
1.0000 | MEDICATED_PATCH | TRANSDERMAL | 0 refills | Status: AC
  Filled 2024-01-17: qty 8, 24d supply, fill #0

## 2024-01-17 MED ORDER — ONDANSETRON HCL 4 MG PO TABS
4.0000 mg | ORAL_TABLET | Freq: Three times a day (TID) | ORAL | 0 refills | Status: AC | PRN
  Filled 2024-01-17: qty 20, 7d supply, fill #0

## 2024-02-08 DIAGNOSIS — G4733 Obstructive sleep apnea (adult) (pediatric): Secondary | ICD-10-CM | POA: Diagnosis not present

## 2024-02-12 ENCOUNTER — Other Ambulatory Visit: Payer: Self-pay

## 2024-02-29 DIAGNOSIS — G4733 Obstructive sleep apnea (adult) (pediatric): Secondary | ICD-10-CM | POA: Diagnosis not present

## 2024-03-10 DIAGNOSIS — G4733 Obstructive sleep apnea (adult) (pediatric): Secondary | ICD-10-CM | POA: Diagnosis not present

## 2024-03-11 DIAGNOSIS — L218 Other seborrheic dermatitis: Secondary | ICD-10-CM | POA: Diagnosis not present

## 2024-03-11 DIAGNOSIS — I308 Other forms of acute pericarditis: Secondary | ICD-10-CM | POA: Diagnosis not present

## 2024-03-12 ENCOUNTER — Other Ambulatory Visit: Payer: Self-pay

## 2024-03-12 MED ORDER — FLUTICASONE PROPIONATE 0.05 % EX CREA
TOPICAL_CREAM | Freq: Two times a day (BID) | CUTANEOUS | 3 refills | Status: AC | PRN
  Filled 2024-03-12: qty 30, 30d supply, fill #0

## 2024-03-12 MED ORDER — KETOCONAZOLE 2 % EX CREA
TOPICAL_CREAM | Freq: Two times a day (BID) | CUTANEOUS | 3 refills | Status: AC | PRN
  Filled 2024-03-12: qty 30, 30d supply, fill #0

## 2024-03-12 MED ORDER — CLOBETASOL PROPIONATE 0.05 % EX SOLN
Freq: Two times a day (BID) | CUTANEOUS | 3 refills | Status: AC
  Filled 2024-03-12: qty 50, 30d supply, fill #0

## 2024-03-12 MED ORDER — KETOCONAZOLE 2 % EX SHAM
MEDICATED_SHAMPOO | CUTANEOUS | 99 refills | Status: AC
  Filled 2024-03-12: qty 120, 30d supply, fill #0

## 2024-03-12 MED ORDER — KETOCONAZOLE 2 % EX CREA
TOPICAL_CREAM | Freq: Two times a day (BID) | CUTANEOUS | 3 refills | Status: DC
  Filled 2024-03-12: qty 60, 30d supply, fill #0

## 2024-04-10 DIAGNOSIS — G4733 Obstructive sleep apnea (adult) (pediatric): Secondary | ICD-10-CM | POA: Diagnosis not present

## 2024-06-10 DIAGNOSIS — G4733 Obstructive sleep apnea (adult) (pediatric): Secondary | ICD-10-CM | POA: Diagnosis not present

## 2024-06-12 ENCOUNTER — Other Ambulatory Visit: Payer: Self-pay

## 2024-06-12 ENCOUNTER — Ambulatory Visit
Admission: EM | Admit: 2024-06-12 | Discharge: 2024-06-12 | Disposition: A | Discharge status: Home / Self Care | Attending: Emergency Medicine | Admitting: Emergency Medicine

## 2024-06-12 DIAGNOSIS — L03116 Cellulitis of left lower limb: Secondary | ICD-10-CM

## 2024-06-12 DIAGNOSIS — L237 Allergic contact dermatitis due to plants, except food: Secondary | ICD-10-CM | POA: Diagnosis not present

## 2024-06-12 MED ORDER — CEPHALEXIN 500 MG PO CAPS
500.0000 mg | ORAL_CAPSULE | Freq: Four times a day (QID) | ORAL | 0 refills | Status: AC
  Filled 2024-06-12: qty 28, 7d supply, fill #0

## 2024-06-12 MED ORDER — PREDNISONE 10 MG PO TABS
ORAL_TABLET | ORAL | 0 refills | Status: AC
  Filled 2024-06-12: qty 21, 6d supply, fill #0

## 2024-06-12 NOTE — Discharge Instructions (Addendum)
 Take Zyrtec as directed.  Take the prescribed cephalexin and prednisone  as directed.  Follow-up with your primary care provider in 2 to 3 days for recheck.  Follow-up right away or go to the emergency department if you have worsening symptoms.

## 2024-06-12 NOTE — ED Provider Notes (Signed)
 Jesus Carroll    CSN: [id number] Arrival date & time: 06/12/24  9175      History   Chief Complaint Chief Complaint  Patient presents with   Rash    HPI Jesus Carroll is a 43 y.o. male.  Patient presents with poison ivy rash.  The rash started on his ankles 10 days ago after he was clearing out some brush.  It has spread to his legs and arms.  The areas on his left ankle have become red and inflamed.  No fever, chills, purulent drainage.  He has been treating the rash with triamcinolone  cream.  He has been taking Tylenol for the discomfort.  The history is provided by the patient and medical records.    Past Medical History:  Diagnosis Date   Acute upper respiratory infection 04/19/2011   rest, fluids, mucinex-d, ibuprofen , zyrtec, no need for abx, monitor, can  return to work with good hand hygiene, reviewed  ICD10 Updates   Allergic conjunctivitis 06/03/2019   Coughing/wheezing 06/03/2019   Poison ivy dermatitis 04/01/2021    Patient Active Problem List   Diagnosis Date Noted   Scrotal itching 05/30/2022   Dorsalgia, unspecified 12/10/2021   History of urinary stone 12/10/2021   Pain in right foot 12/10/2021   Shoulder pain, left 06/02/2021   Family history of thyroid  cancer 09/07/2020   Family history of prostate cancer in father 09/07/2020   Chronic midline low back pain without sciatica 07/07/2020   Hyperlipidemia 09/05/2019   Other allergic rhinitis 06/03/2019   Preventative health care 12/26/2018   Rash 10/03/2016   Elbow pain 02/26/2014   Numbness and tingling in left arm 02/26/2014   Acute upper respiratory infection 04/19/2011   Tobacco use disorder 04/19/2011    Past Surgical History:  Procedure Laterality Date   MICRODISCECTOMY LUMBAR  2006       Home Medications    Prior to Admission medications   Medication Sig Start Date End Date Taking? Authorizing Provider  cephALEXin (KEFLEX) 500 MG capsule Take 1 capsule (500 mg total) by  mouth 4 (four) times daily. 06/12/24  Yes Corlis Burnard DEL, NP  predniSONE  (DELTASONE ) 10 MG tablet Take 6 tablets (60 mg total) by mouth daily for 1 day, THEN 5 tablets (50 mg total) daily for 1 day, THEN 4 tablets (40 mg total) daily for 1 day, THEN 3 tablets (30 mg total) daily for 1 day, THEN 2 tablets (20 mg total) daily for 1 day, THEN 1 tablet (10 mg total) daily for 1 day. 06/12/24 06/18/24 Yes Corlis Burnard DEL, NP  albuterol  (VENTOLIN  HFA) 108 (90 Base) MCG/ACT inhaler Inhale 2 puffs into the lungs every 6 (six) hours as needed for wheezing or shortness of breath. 08/31/21   Gladis Elsie BROCKS, PA-C  clobetasol  (TEMOVATE ) 0.05 % external solution Apply to affected areas of scalp topically up to twice a day (not to face, groin, underarm) . 03/11/24     cyclobenzaprine  (FLEXERIL ) 10 MG tablet Take 1 tablet (10 mg total) by mouth 3 (three) times daily as needed for muscle spasms. 01/05/22   Tobie Franky SQUIBB, DPM  fluticasone  (CUTIVATE ) 0.05 % cream Apply to affected areas topically up to 2 (two) times daily as needed and mix with nizoral  cream equally. 03/11/24     ibuprofen  (ADVIL ) 600 MG tablet Take 600 mg by mouth every 8 (eight) hours. 12/10/21   [provider]  ketoconazole  (NIZORAL ) 2 % cream Apply to affected areas topically 2 (two) times  daily as needed and mix with cutivate  cream equally. 03/11/24     ketoconazole  (NIZORAL ) 2 % shampoo Use shampoo twice a week 03/11/24     ondansetron  (ZOFRAN ) 4 MG tablet Take 1 tablet (4 mg total) by mouth every 8 (eight) hours as needed for nausea. 01/17/24   Borders, Fonda SAUNDERS, NP  scopolamine  (TRANSDERM-SCOP) 1 MG/3DAYS Place 1 patch (1.5 mg total) onto the skin behind the ear every 3 (three) days as needed for motion sickness. 01/17/24   Borders, Fonda SAUNDERS, NP  triamcinolone  cream (KENALOG ) 0.5 % Apply 1 Application topically 2 (two) times daily. 03/30/23   Gretta Comer POUR, NP    Family History Family History  Problem Relation Age of Onset   Breast cancer  Mother    Skin cancer Mother    Allergic rhinitis Mother    Prostate cancer Father 69   Thyroid  cancer Father    Allergic rhinitis Father    Lung cancer Maternal Grandfather    Cancer Paternal Grandfather        Unclear   Eczema Sister    Angioedema Neg Hx    Asthma Neg Hx    Atopy Neg Hx    Immunodeficiency Neg Hx    Urticaria Neg Hx     Social History Social History   Tobacco Use   Smoking status: Former    Passive exposure: Past   Smokeless tobacco: Never  Vaping Use   Vaping status: Never Used  Substance Use Topics   Alcohol use: Yes    Comment: TWICE A WEEK   Drug use: Never     Allergies   Patient has no known allergies.   Review of Systems Review of Systems  Constitutional:  Negative for chills and fever.  Musculoskeletal:  Negative for arthralgias and joint swelling.  Skin:  Positive for color change and rash.  Neurological:  Negative for weakness and numbness.     Physical Exam Triage Vital Signs ED Triage Vitals  Encounter Vitals Group     BP 06/12/24 0903 112/74     Girls Systolic BP Percentile --      Girls Diastolic BP Percentile --      Boys Systolic BP Percentile --      Boys Diastolic BP Percentile --      Pulse Rate 06/12/24 0903 60     Resp 06/12/24 0903 18     Temp 06/12/24 0903 98 F (36.7 C)     Temp src --      SpO2 06/12/24 0903 98 %     Weight --      Height --      Head Circumference --      Peak Flow --      Pain Score 06/12/24 0909 0     Pain Loc --      Pain Education --      Exclude from Growth Chart --    No data found.  Updated Vital Signs BP 112/74   Pulse 60   Temp 98 F (36.7 C)   Resp 18   SpO2 98%   Visual Acuity Right Eye Distance:   Left Eye Distance:   Bilateral Distance:    Right Eye Near:   Left Eye Near:    Bilateral Near:     Physical Exam Constitutional:      General: He is not in acute distress. HENT:     Mouth/Throat:     Mouth: Mucous membranes are moist.  Cardiovascular:  Rate and Rhythm: Normal rate.  Pulmonary:     Effort: Pulmonary effort is normal. No respiratory distress.  Musculoskeletal:        General: No deformity. Normal range of motion.  Skin:    General: Skin is warm and dry.     Capillary Refill: Capillary refill takes less than 2 seconds.     Findings: Erythema and rash present.     Comments: Erythematous patchy vesicular rash.  See pictures for details.  Neurological:     General: No focal deficit present.     Mental Status: He is alert.     Sensory: No sensory deficit.     Motor: No weakness.     Gait: Gait normal.             UC Treatments / Results  Labs (all labs ordered are listed, but only abnormal results are displayed) Labs Reviewed - No data to display  EKG   Radiology No results found.  Procedures Procedures (including critical care time)  Medications Ordered in UC Medications - No data to display  Initial Impression / Assessment and Plan / UC Course  I have reviewed the triage vital signs and the nursing notes.  Pertinent labs & imaging results that were available during my care of the patient were reviewed by me and considered in my medical decision making (see chart for details).    Cellulitis of left ankle, poison ivy dermatitis.  Afebrile and vital signs are stable.  Instructed patient to take Zyrtec daily x 14 days.  Treating today with 7-day course of cephalexin and 6-day course of prednisone  taper.  Instructed him to follow-up with his PCP in 2 to 3 days for recheck.  ED precautions given.  Education provided on cellulitis and poison ivy.  He agrees to plan of care.  Final Clinical Impressions(s) / UC Diagnoses   Final diagnoses:  Cellulitis of left ankle  Poison ivy dermatitis     Discharge Instructions      Take Zyrtec as directed.  Take the prescribed cephalexin and prednisone  as directed.  Follow-up with your primary care provider in 2 to 3 days for recheck.  Follow-up right away or go  to the emergency department if you have worsening symptoms.     ED Prescriptions     Medication Sig Dispense Auth. Provider   predniSONE  (DELTASONE ) 10 MG tablet Take 6 tablets (60 mg total) by mouth daily for 1 day, THEN 5 tablets (50 mg total) daily for 1 day, THEN 4 tablets (40 mg total) daily for 1 day, THEN 3 tablets (30 mg total) daily for 1 day, THEN 2 tablets (20 mg total) daily for 1 day, THEN 1 tablet (10 mg total) daily for 1 day. 21 tablet Corlis Sor H, NP   cephALEXin (KEFLEX) 500 MG capsule Take 1 capsule (500 mg total) by mouth 4 (four) times daily. 28 capsule Corlis Sor DEL, NP      PDMP not reviewed this encounter.   Corlis Sor DEL, NP 06/12/24 [phone]

## 2024-06-12 NOTE — ED Triage Notes (Signed)
 Patient states about 1.5 weeks ago he was clearing out brush. He believes he has poison ivy. It started on his ankles that has spread up to his legs and arms. The rash is red, raised, and itching.   He has an area on the back on his left ankle that has become quite irritated.   He has been using Triamcinolone  0.1% cream. He has also been doing 1500 mg Tylenol per day.

## 2024-07-10 DIAGNOSIS — G4733 Obstructive sleep apnea (adult) (pediatric): Secondary | ICD-10-CM | POA: Diagnosis not present

## 2024-08-14 ENCOUNTER — Encounter: Admitting: Primary Care

## 2024-09-04 ENCOUNTER — Encounter: Admitting: Primary Care
